# Patient Record
Sex: Female | Born: 1990 | Race: Black or African American | Hispanic: No | Marital: Single | Smoking: Never smoker
Health system: Southern US, Community
[De-identification: ages and names within clinical notes are randomized; demographics above are authoritative.]

## PROBLEM LIST (undated history)

## (undated) DIAGNOSIS — J45909 Unspecified asthma, uncomplicated: Secondary | ICD-10-CM

## (undated) HISTORY — PX: OTHER SURGICAL HISTORY: SHX169

## (undated) HISTORY — DX: Unspecified asthma, uncomplicated: J45.909

---
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
## 2012-04-27 IMAGING — CR DG CHEST 2V
1 series · 2 of 2 positions shown · non-contrast
Comparison: none

REASON FOR EXAM: wheezing, shortness of breath
COMMENTS:

PROCEDURE:     DXR - DXR CHEST PA (OR AP) AND LATERAL  - January 13, 2011  [DATE]
RESULT:     Comparison is made to the prior exam of 05/09/2008. The lung
fields are clear. The heart, mediastinal and osseous structures reveal no
significant abnormalities.

[Series 1: w chest pa · 0.14mm/px · 2 of 2 slices shown]
[im 1/2]
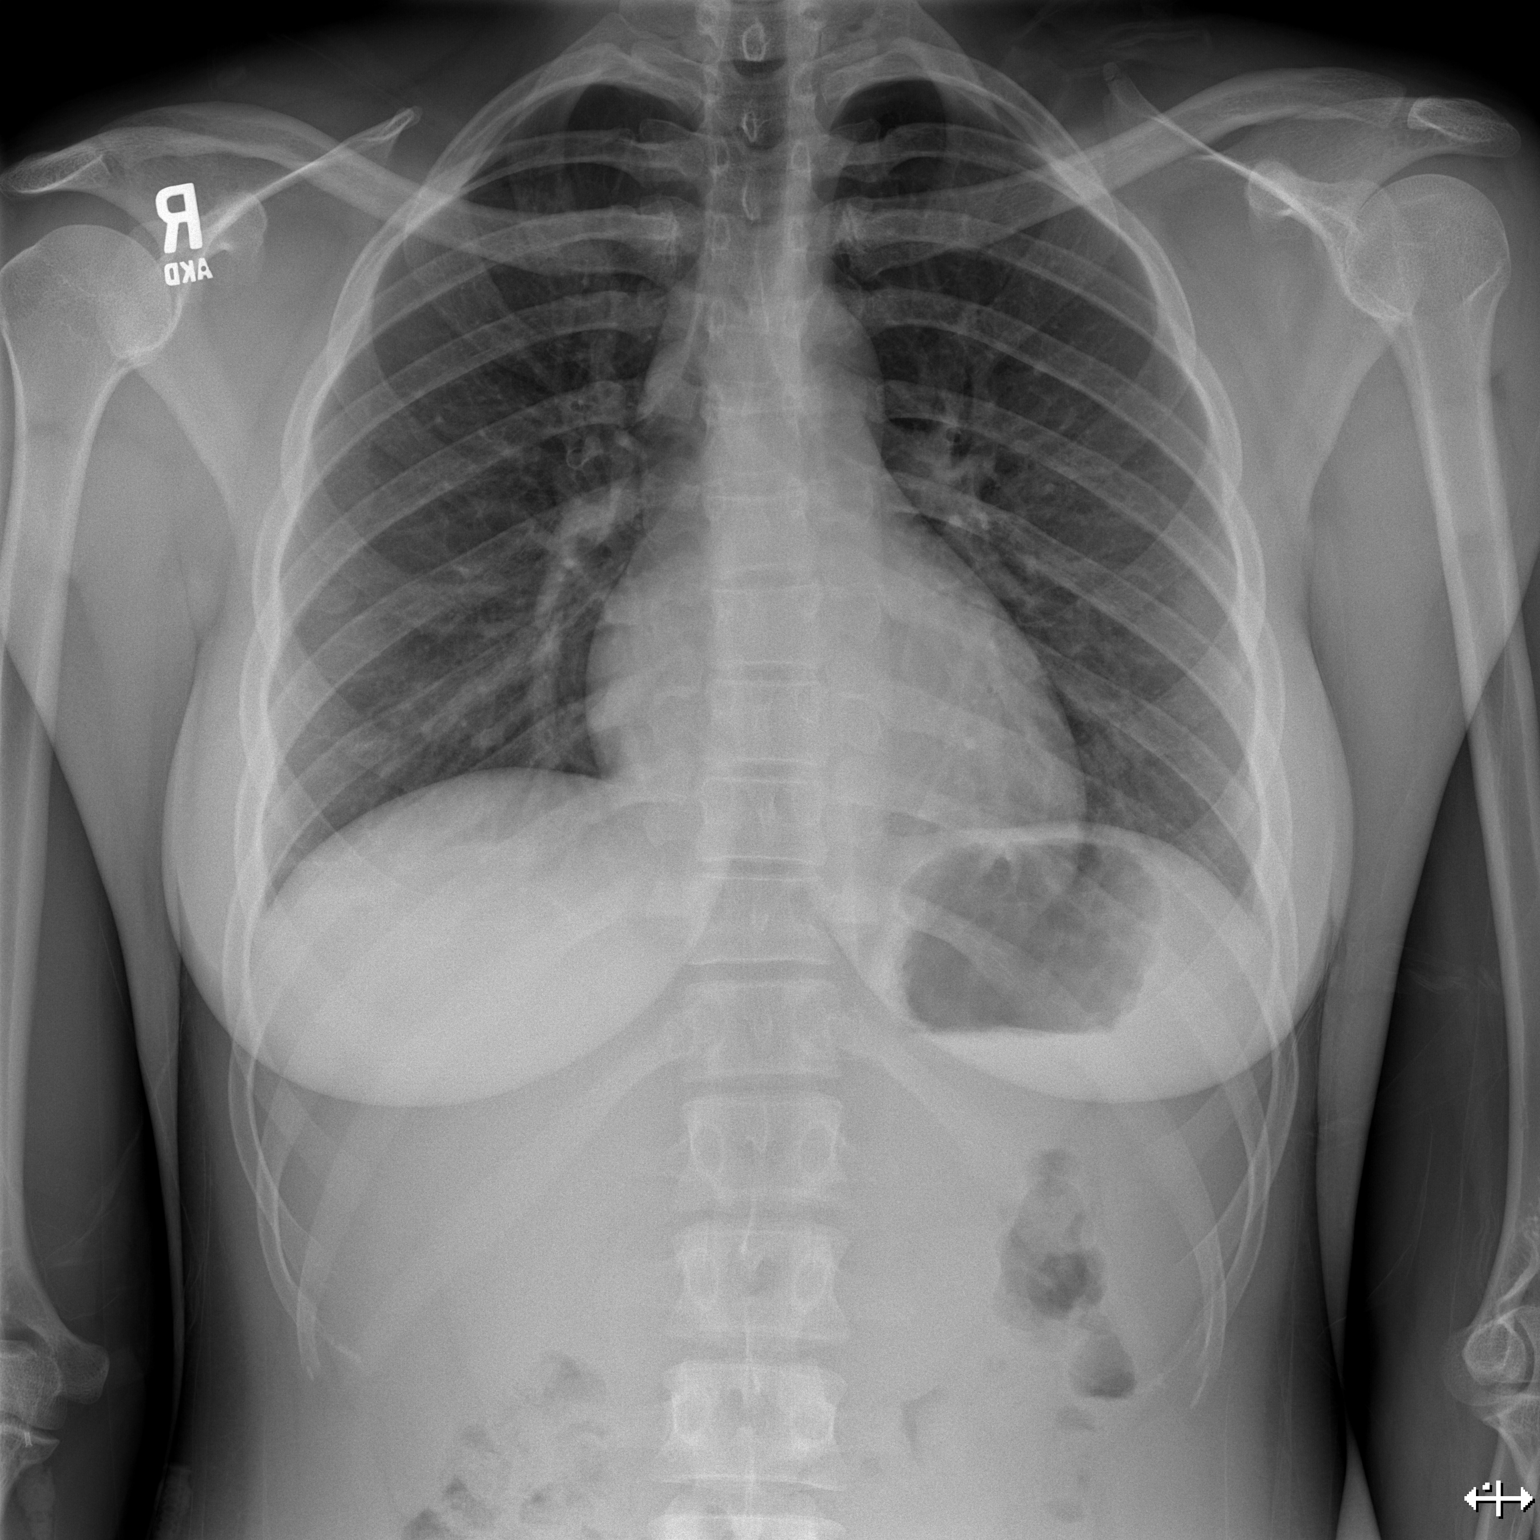
[im 2/2]
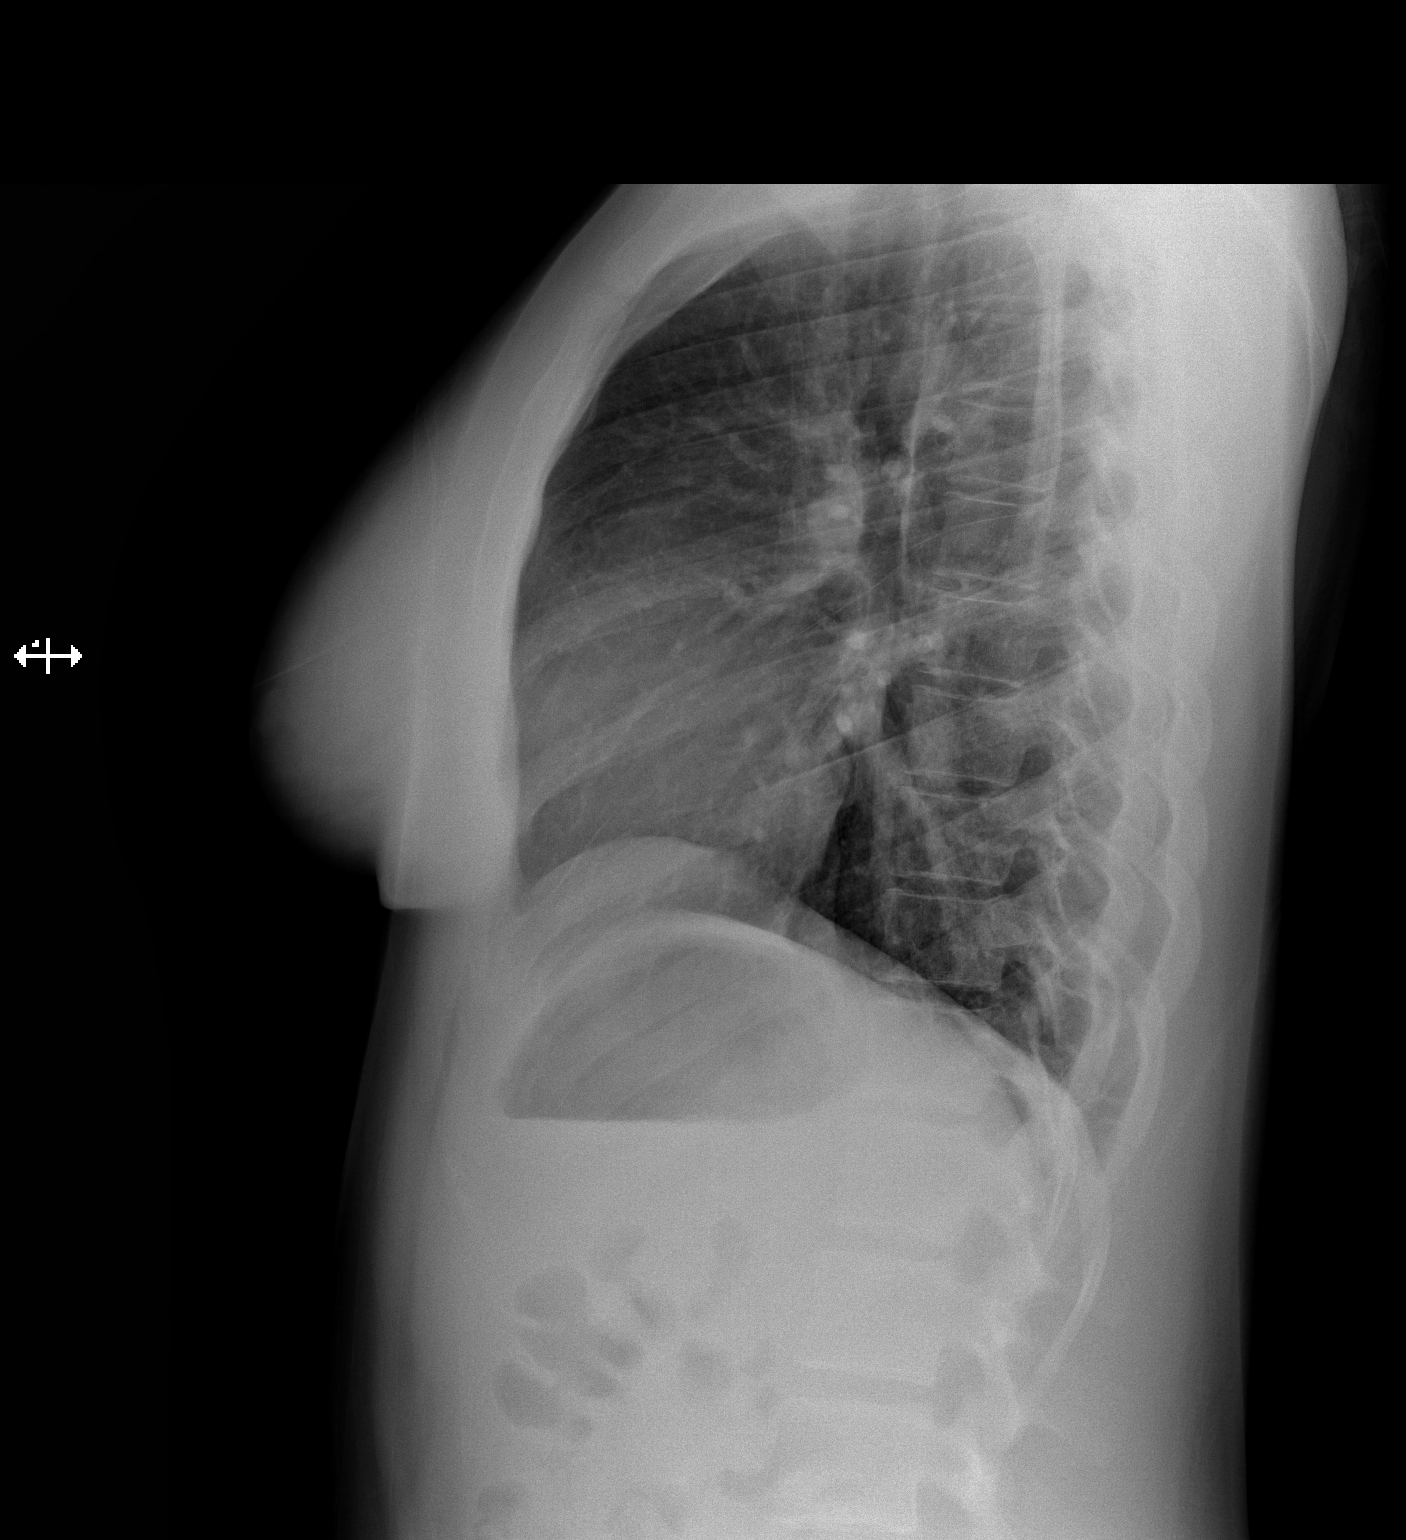

[2 of 2 positions shown; findings below may reference images not displayed]

IMPRESSION: No acute changes are identified.

## 2013-05-20 ENCOUNTER — Emergency Department: Payer: Self-pay | Admitting: Emergency Medicine

## 2013-12-15 ENCOUNTER — Emergency Department: Payer: Self-pay | Admitting: Internal Medicine

## 2014-01-19 ENCOUNTER — Emergency Department: Payer: Self-pay | Admitting: Emergency Medicine

## 2014-01-19 LAB — URINALYSIS, COMPLETE
Bilirubin,UR: NEGATIVE
Blood: NEGATIVE
Glucose,UR: NEGATIVE mg/dL (ref 0–75)
Ketone: NEGATIVE
Nitrite: NEGATIVE
PH: 6 (ref 4.5–8.0)
PROTEIN: NEGATIVE
RBC,UR: 1 /HPF (ref 0–5)
Specific Gravity: 1.02 (ref 1.003–1.030)
Squamous Epithelial: 4
WBC UR: 4 /HPF (ref 0–5)

## 2014-01-19 LAB — CBC WITH DIFFERENTIAL/PLATELET
BASOS ABS: 0.1 10*3/uL (ref 0.0–0.1)
Basophil %: 0.6 %
Eosinophil #: 0.2 10*3/uL (ref 0.0–0.7)
Eosinophil %: 2 %
HCT: 38 % (ref 35.0–47.0)
HGB: 13.1 g/dL (ref 12.0–16.0)
Lymphocyte #: 2.9 10*3/uL (ref 1.0–3.6)
Lymphocyte %: 33.4 %
MCH: 29.5 pg (ref 26.0–34.0)
MCHC: 34.4 g/dL (ref 32.0–36.0)
MCV: 86 fL (ref 80–100)
MONOS PCT: 4.6 %
Monocyte #: 0.4 x10 3/mm (ref 0.2–0.9)
Neutrophil #: 5.1 10*3/uL (ref 1.4–6.5)
Neutrophil %: 59.4 %
PLATELETS: 195 10*3/uL (ref 150–440)
RBC: 4.43 10*6/uL (ref 3.80–5.20)
RDW: 14.4 % (ref 11.5–14.5)
WBC: 8.7 10*3/uL (ref 3.6–11.0)

## 2014-01-19 LAB — COMPREHENSIVE METABOLIC PANEL
ALT: 12 U/L — AB
Albumin: 4.1 g/dL (ref 3.4–5.0)
Alkaline Phosphatase: 38 U/L — ABNORMAL LOW
Anion Gap: 6 — ABNORMAL LOW (ref 7–16)
BUN: 12 mg/dL (ref 7–18)
Bilirubin,Total: 1.2 mg/dL — ABNORMAL HIGH (ref 0.2–1.0)
CALCIUM: 8.9 mg/dL (ref 8.5–10.1)
CHLORIDE: 106 mmol/L (ref 98–107)
Co2: 27 mmol/L (ref 21–32)
Creatinine: 1.01 mg/dL (ref 0.60–1.30)
EGFR (African American): >60
EGFR (Non-African Amer.): >60
Glucose: 80 mg/dL (ref 65–99)
Osmolality: 276 (ref 275–301)
POTASSIUM: 3.5 mmol/L (ref 3.5–5.1)
SGOT(AST): 6 U/L — ABNORMAL LOW (ref 15–37)
Sodium: 139 mmol/L (ref 136–145)
Total Protein: 7.5 g/dL (ref 6.4–8.2)

## 2014-01-19 LAB — LIPASE, BLOOD: LIPASE: 106 U/L (ref 73–393)

## 2014-05-30 ENCOUNTER — Encounter: Payer: Self-pay | Admitting: Emergency Medicine

## 2014-05-30 ENCOUNTER — Emergency Department
Admission: EM | Admit: 2014-05-30 | Discharge: 2014-05-30 | Disposition: A | Discharge status: Home / Self Care | Payer: Self-pay | Attending: Emergency Medicine | Admitting: Emergency Medicine

## 2014-05-30 DIAGNOSIS — R1032 Left lower quadrant pain: Secondary | ICD-10-CM | POA: Insufficient documentation

## 2014-05-30 DIAGNOSIS — R110 Nausea: Secondary | ICD-10-CM | POA: Insufficient documentation

## 2014-05-30 LAB — URINALYSIS COMPLETE WITH MICROSCOPIC (ARMC ONLY)
BACTERIA UA: NONE SEEN
Bilirubin Urine: NEGATIVE
Glucose, UA: NEGATIVE mg/dL
Ketones, ur: NEGATIVE mg/dL
LEUKOCYTES UA: NEGATIVE
Nitrite: NEGATIVE
PROTEIN: NEGATIVE mg/dL
SPECIFIC GRAVITY, URINE: 1.019 (ref 1.005–1.030)
pH: 6 (ref 5.0–8.0)

## 2014-05-30 LAB — CBC WITH DIFFERENTIAL/PLATELET
Basophils Absolute: 0.1 10*3/uL (ref 0–0.1)
Basophils Relative: 1 %
EOS ABS: 0.3 10*3/uL (ref 0–0.7)
Eosinophils Relative: 3 %
HCT: 39.9 % (ref 35.0–47.0)
HEMOGLOBIN: 13.9 g/dL (ref 12.0–16.0)
LYMPHS PCT: 35 %
Lymphs Abs: 3.8 10*3/uL — ABNORMAL HIGH (ref 1.0–3.6)
MCH: 29.7 pg (ref 26.0–34.0)
MCHC: 34.8 g/dL (ref 32.0–36.0)
MCV: 85.4 fL (ref 80.0–100.0)
Monocytes Absolute: 0.4 10*3/uL (ref 0.2–0.9)
Monocytes Relative: 3 %
NEUTROS ABS: 6.4 10*3/uL (ref 1.4–6.5)
Neutrophils Relative %: 58 %
PLATELETS: 215 10*3/uL (ref 150–440)
RBC: 4.67 MIL/uL (ref 3.80–5.20)
RDW: 14.6 % — AB (ref 11.5–14.5)
WBC: 11 10*3/uL (ref 3.6–11.0)

## 2014-05-30 LAB — COMPREHENSIVE METABOLIC PANEL
ALT: 8 U/L — AB (ref 14–54)
ANION GAP: 6 (ref 5–15)
AST: 10 U/L — ABNORMAL LOW (ref 15–41)
Albumin: 4.8 g/dL (ref 3.5–5.0)
Alkaline Phosphatase: 41 U/L (ref 38–126)
BUN: 15 mg/dL (ref 6–20)
CO2: 28 mmol/L (ref 22–32)
CREATININE: 0.95 mg/dL (ref 0.44–1.00)
Calcium: 9.2 mg/dL (ref 8.9–10.3)
Chloride: 104 mmol/L (ref 101–111)
GFR calc Af Amer: >60 mL/min (ref >60)
Glucose, Bld: 104 mg/dL — ABNORMAL HIGH (ref 65–99)
Potassium: 3.4 mmol/L — ABNORMAL LOW (ref 3.5–5.1)
Sodium: 138 mmol/L (ref 135–145)
TOTAL PROTEIN: 8 g/dL (ref 6.5–8.1)
Total Bilirubin: 1.1 mg/dL (ref 0.3–1.2)

## 2014-05-30 LAB — POCT PREGNANCY, URINE: PREG TEST UR: NEGATIVE

## 2014-05-30 MED ORDER — ONDANSETRON 8 MG PO TBDP
8.0000 mg | ORAL_TABLET | Freq: Once | ORAL | Status: AC
  Administered 2014-05-30: 8 mg via ORAL

## 2014-05-30 NOTE — ED Notes (Signed)
Patient here with complaint of left lower abd pain and nausea that started this afternoon. Patient denies diarrhea or urinary symptoms.

## 2020-11-15 ENCOUNTER — Other Ambulatory Visit: Payer: Self-pay

## 2020-11-15 ENCOUNTER — Emergency Department
Admission: EM | Admit: 2020-11-15 | Discharge: 2020-11-15 | Disposition: A | Discharge status: Home / Self Care | Payer: Self-pay | Attending: Emergency Medicine | Admitting: Emergency Medicine

## 2020-11-15 DIAGNOSIS — Z3202 Encounter for pregnancy test, result negative: Secondary | ICD-10-CM | POA: Insufficient documentation

## 2020-11-15 LAB — POC URINE PREG, ED: Preg Test, Ur: NEGATIVE

## 2020-11-15 NOTE — ED Triage Notes (Signed)
Pt states "im really just here to find out if im pregnant, I took two at home test and they say negative but I havent had my period since sept" NAD noted

## 2020-11-15 NOTE — ED Provider Notes (Signed)
[facility] Emergency Department Provider Note   ____________________________________________   Event Date/Time   First MD Initiated Contact with Patient 11/15/20 1058     (approximate)  I have reviewed the triage vital signs and the nursing notes.   HISTORY  Chief Complaint Possible Pregnancy   HPI Christine Conway is a 30 y.o. female presents to the ED to find out if she is pregnant.  Patient states that she has taken 2 home pregnancy test both of which were negative.  Patient reports that she has not had a menses since September.  Patient relates that she has been in prison and had some healthcare and annual Pap smears which were all unremarkable.  She denies any abdominal pain or vaginal discharge.       History reviewed. No pertinent past medical history.  There are no problems to display for this patient.   History reviewed. No pertinent surgical history.  Prior to Admission medications   Not on File    Allergies Patient has no known allergies.  No family history on file.  Social History Social History   Tobacco Use   Smoking status: Never  Substance Use Topics   Alcohol use: No   Drug use: No    Review of Systems Constitutional: No fever/chills Eyes: No visual changes. ENT: No sore throat. Cardiovascular: Denies chest pain. Respiratory: Denies shortness of breath. Gastrointestinal: No abdominal pain.  No nausea, no vomiting.  No diarrhea.  No constipation. Genitourinary: Negative for dysuria.  Amenorrhea x2 months Musculoskeletal: Negative for back pain. Skin: Negative for rash. Neurological: Negative for headaches, focal weakness or numbness.   ____________________________________________   PHYSICAL EXAM:  VITAL SIGNS: ED Triage Vitals  Enc Vitals Group     BP 11/15/20 1048 139/80     Pulse Rate 11/15/20 1048 79     Resp 11/15/20 1048 16     Temp 11/15/20 1048 98.2 F (36.8 C)     Temp Source 11/15/20 1048  Oral     SpO2 11/15/20 1048 99 %     Weight 11/15/20 1046 22 lb (9.979 kg)     Height 11/15/20 1046 5\' 7"  (1.702 m)     Head Circumference --      Peak Flow --      Pain Score 11/15/20 1049 0     Pain Loc --      Pain Edu? --      Excl. in GC? --    Constitutional: Alert and oriented. Well appearing and in no acute distress. Eyes: Conjunctivae are normal. PERRL. EOMI. Head: Atraumatic. Nose: No congestion/rhinnorhea. Neck: No stridor.   Cardiovascular: Normal rate, regular rhythm. Grossly normal heart sounds.  Good peripheral circulation. Respiratory: Normal respiratory effort.  No retractions. Lungs CTAB. Gastrointestinal: Soft and nontender. No distention.  Musculoskeletal: Moves upper and lower extremities any difficulty.  Normal gait was noted. Neurologic:  Normal speech and language. No gross focal neurologic deficits are appreciated. No gait instability. Skin:  Skin is warm, dry and intact. No rash noted. Psychiatric: Mood and affect are normal. Speech and behavior are normal.  ____________________________________________   LABS (all labs ordered are listed, but only abnormal results are displayed)  Labs Reviewed  POC URINE PREG, ED     PROCEDURES  Procedure(s) performed (including Critical Care):  Procedures   ____________________________________________   INITIAL IMPRESSION / ASSESSMENT AND PLAN / ED COURSE  As part of my medical decision making, I reviewed the following data within the  electronic MEDICAL RECORD NUMBER Notes from prior ED visits and [place] Controlled Substance Database  30 year old female presents to the ED for pregnancy test.  Patient has been without menses for approximately 2 months.  Patient took 2 home pregnancy is both of which were negative.  Test in the emergency department was negative.  Patient denies any abdominal pain or vaginal discharge.  We discussed possible stress or lifestyle changes due to recently getting out of prison.  She is to  follow-up with the [facility] department if any continued problems or for annual pelvic exams as she does not currently have a PCP.   ____________________________________________   FINAL CLINICAL IMPRESSION(S) / ED DIAGNOSES  Final diagnoses:  Encounter for pregnancy test with result negative     ED Discharge Orders     None        Note:  This document was prepared using Dragon voice recognition software and may include unintentional dictation errors.    Tommi Rumps, PA-C 11/15/20 1131    Minna Antis, MD 11/15/20 1146

## 2020-11-15 NOTE — Discharge Instructions (Addendum)
Follow-up with [facility] department if any continued concerns and also annual Pap smears. Pregnancy test in the ED is negative.

## 2021-01-16 ENCOUNTER — Ambulatory Visit: Payer: Self-pay | Admitting: Nurse Practitioner

## 2021-02-09 ENCOUNTER — Encounter: Payer: Self-pay | Admitting: Advanced Practice Midwife

## 2021-02-09 ENCOUNTER — Other Ambulatory Visit: Payer: Self-pay

## 2021-02-09 ENCOUNTER — Ambulatory Visit (LOCAL_COMMUNITY_HEALTH_CENTER): Payer: Medicaid Other | Admitting: Advanced Practice Midwife

## 2021-02-09 VITALS — BP 119/70 | HR 57 | Temp 97.5°F | Ht 67.0 in | Wt 203.0 lb

## 2021-02-09 DIAGNOSIS — Z3009 Encounter for other general counseling and advice on contraception: Secondary | ICD-10-CM

## 2021-02-09 DIAGNOSIS — Z30013 Encounter for initial prescription of injectable contraceptive: Secondary | ICD-10-CM

## 2021-02-09 DIAGNOSIS — Z3202 Encounter for pregnancy test, result negative: Secondary | ICD-10-CM

## 2021-02-09 DIAGNOSIS — E669 Obesity, unspecified: Secondary | ICD-10-CM

## 2021-02-09 LAB — PREGNANCY, URINE: Preg Test, Ur: NEGATIVE

## 2021-02-09 LAB — HM HIV SCREENING LAB: HM HIV Screening: NEGATIVE

## 2021-02-09 MED ORDER — MEDROXYPROGESTERONE ACETATE 150 MG/ML IM SUSP
150.0000 mg | Freq: Once | INTRAMUSCULAR | Status: AC
  Administered 2021-02-09: 150 mg via INTRAMUSCULAR

## 2021-02-09 NOTE — Progress Notes (Signed)
ROI for all PAP smear reports signed and faxed to [facility] with fax confirmation received. Client's OPUS number included on ROI. Client tolerated Depo without complaint. Jossie Ng, RN

## 2021-02-09 NOTE — Progress Notes (Signed)
[facility] Number: [phone]    Family Planning Visit- Initial Visit  Subjective:  Christine Conway is a 31 y.o.  SBF nonsmoker [id number]   being seen today for an initial annual visit and to discuss reproductive life planning.  The patient is currently using No Method - Other Reason for pregnancy prevention. Patient reports   does not want a pregnancy in the next year.  Patient has the following medical conditions has Obesity BMI=31.7 on their problem list.  Chief Complaint  Patient presents with   Annual Exam    Patient reports here for physical, birth control, pap. Pt states Christine Conway is on her period and is refusing exam from waist down today but wants DMPA. Last sex "around Christmas" without condom; with current partner x 1 year; 1 partner in last 3 mo. LMP 02/06/21. Has been incarcerated from 2016-2022 for armed robbery. Thinks Christine Conway had a pap while incarcerated in [facility] in [place], [place].  employed 40 hrs/wk and living with her sister.  Last dental exam age 49. Last ETOH age 3 (89 [place]).  Patient denies cigs, vaping, cigars, MJ  Body mass index is 31.79 kg/m. - Patient is eligible for diabetes screening based on BMI and age [id number]?  not applicable [id number] ordered? not applicable  Patient reports 1  partner/s in last year. Desires STI screening?  No - refusing speculum exam, pap, cultures, bimanual today  Has patient been screened once for HCV in the past?  No  No results found for: HCVAB  Does the patient have current drug use (including MJ), have a partner with drug use, and/or has been incarcerated since last result? Yes  If yes-- Screen for HCV through [facility]   Does the patient meet criteria for HBV testing? Yes  Criteria:  -Household, sexual or needle sharing contact with HBV -History of drug use -HIV positive -Those with known Hep C   Health Maintenance Due  Topic Date Due    COVID-19 Vaccine (1) Never done   HIV Screening  Never done   Hepatitis C Screening  Never done   TETANUS/TDAP  Never done   PAP-Cervical Cytology Screening  Never done   PAP SMEAR-Modifier  Never done   INFLUENZA VACCINE  Never done    Review of Systems  All other systems reviewed and are negative.  The following portions of the patient's history were reviewed and updated as appropriate: allergies, current medications, past family history, past medical history, past social history, past surgical history and problem list. Problem list updated.   See flowsheet for other program required questions.  Objective:   Vitals:   02/09/21 1056  BP: 119/70  Pulse: (!) 57  Temp: (!) 97.5 F (36.4 C)  Weight: 203 lb (92.1 kg)  Height: 5\' 7"  (1.702 m)    Physical Exam Constitutional:      Appearance: Normal appearance. Christine Conway is obese.  HENT:     Head: Normocephalic and atraumatic.     Mouth/Throat:     Mouth: Mucous membranes are moist.     Comments: Last dental exam age 63; needs exam asap Eyes:     Conjunctiva/sclera: Conjunctivae normal.  Neck:     Thyroid: No thyroid mass, thyromegaly or thyroid tenderness.  Cardiovascular:     Rate and Rhythm: Normal rate and regular rhythm.  Pulmonary:     Effort: Pulmonary effort is normal.     Breath sounds: Normal breath  sounds.  Chest:  Breasts:    Right: Normal.     Left: Normal.  Abdominal:     Palpations: Abdomen is soft.     Comments: Soft without masses or tenderness, good tone  Genitourinary:    Comments: Pt refused exam below waist due to menses today. Informed would need pap, bimanual before more DMPA to be given Musculoskeletal:        General: Normal range of motion.     Cervical back: Normal range of motion and neck supple.  Skin:    General: Skin is warm and dry.  Neurological:     Mental Status: Christine Conway is alert.  Psychiatric:        Mood and Affect: Mood normal.      Assessment and Plan:  Christine Conway is  a 31 y.o. female presenting to the [facility] Department for an initial annual wellness/contraceptive visit  Contraception counseling: Reviewed all forms of birth control options in the tiered based approach. available including abstinence; over the counter/barrier methods; hormonal contraceptive medication including pill, patch, ring, injection,contraceptive implant, ECP; hormonal and nonhormonal IUDs; permanent sterilization options including vasectomy and the various tubal sterilization modalities. Risks, benefits, and typical effectiveness rates were reviewed.  Questions were answered.  Written information was also given to the patient to review.  Patient desires Hormonal Injection, this was prescribed for patient.    The patient will follow up in  11-13 wks for surveillance.  The patient was told to call with any further questions, or with any concerns about this method of contraception.  Emphasized use of condoms 100% of the time for STI prevention.  Patient was not offered ECP based on not meeting criteria. ECP was not accepted by the patient. ECP counseling was not given - see RN documentation  1. Family planning Immunization nurse consult Please give pt dental list and encourage exam asap Please give pt primary care MD list  ROI for pap while incarcerated at [facility] in [place], [place]  - Pregnancy, urine - [facility] - Syphilis Serology,  Lab  2. Encounter for initial prescription of injectable contraceptive If PT neg today pt desires DMPA 150 mg IM x1 Please counsel on need for abstinance x 7 days  Pt counseled cannot receive any more DMPA unless has completion of physical with pap and bimanual exam  3. Obesity, unspecified classification, unspecified obesity type, unspecified whether serious comorbidity present      No follow-ups on file.  Future Appointments  Date Time Provider [place]  05/15/2021  9:40 AM Jon Billings, NP [facility] [place], CNM

## 2021-03-13 ENCOUNTER — Other Ambulatory Visit: Payer: Self-pay

## 2021-03-13 ENCOUNTER — Ambulatory Visit (LOCAL_COMMUNITY_HEALTH_CENTER): Payer: Medicaid Other | Admitting: Family Medicine

## 2021-03-13 VITALS — BP 123/80 | Ht 69.0 in | Wt 196.8 lb

## 2021-03-13 DIAGNOSIS — Z309 Encounter for contraceptive management, unspecified: Secondary | ICD-10-CM

## 2021-03-13 DIAGNOSIS — Z3042 Encounter for surveillance of injectable contraceptive: Secondary | ICD-10-CM

## 2021-03-13 DIAGNOSIS — Z124 Encounter for screening for malignant neoplasm of cervix: Secondary | ICD-10-CM

## 2021-03-13 MED ORDER — MEDROXYPROGESTERONE ACETATE 150 MG/ML IM SUSP: 150.0000 mg | INTRAMUSCULAR | Status: AC

## 2021-03-13 NOTE — Progress Notes (Signed)
? ?  [facility] problem visit  [facility] ? ?Subjective:  ?Christine Conway is a 31 y.o. being seen today for  ? ?Chief Complaint  ?Patient presents with  ? Annual Exam  ? Contraception  ?  depo  ? ? ?Pt here for pap and bimanual exam. Pt reports that she did not do pap and bimanual at last visit d/t menses.   ? ? ? ?Does the patient have a current or past history of drug use? No  ? No components found for: HCV] ? ? ?Health Maintenance Due  ?Topic Date Due  ? COVID-19 Vaccine (1) Never done  ? Hepatitis C Screening  Never done  ? TETANUS/TDAP  Never done  ? PAP-Cervical Cytology Screening  Never done  ? PAP SMEAR-Modifier  Never done  ? INFLUENZA VACCINE  Never done  ? ? ?ROS ? ?The following portions of the patient's history were reviewed and updated as appropriate: allergies, current medications, past family history, past medical history, past social history, past surgical history and problem list. Problem list updated. ? ? ?See flowsheet for other program required questions. ? ?Objective:  ? ?Vitals:  ? 03/13/21 1109  ?BP: 123/80  ?Weight: 196 lb 12.8 oz (89.3 kg)  ?Height: 5\' 9"  (1.753 m)  ? ? ?Physical Exam ?Nursing note reviewed.  ?Constitutional:   ?   Appearance: Normal appearance.  ?HENT:  ?   Head: Normocephalic and atraumatic.  ?Pulmonary:  ?   Effort: Pulmonary effort is normal.  ?Genitourinary: ?   General: Normal vulva.  ?   Rectum: Normal.  ?   Comments: External genitalia without, lice, nits, erythema, edema , lesions or inguinal adenopathy. Vagina with normal mucosa and discharge and pH equals 4.  Cervix without visual lesions, uterus firm, mobile, non-tender, no masses, CMT adnexal fullness or tenderness.  ? ?Skin: ?   General: Skin is warm and dry.  ?Neurological:  ?   Mental Status: She is alert.  ? ? ? ? ?Assessment and Plan:  ?Christine Conway is a 31 y.o. female presenting to the [facility] Department for a Women's Health problem visit ? ?1.  Encounter for Papanicolaou smear of cervix ?Pap today, 1/2 of patient physical was done on 02/09/21.  Pt at that time refused pap and bimanual d/t menses.   ?Did discuss with patient that in the future that pap could be completed on menses if she felt comfortable.   ? ?Pt declined STI testing today.  ? ?- IGP, rfx Aptima HPV ASCU ? ?2. Encounter for surveillance of injectable contraceptive ?Pt due next depo 04/27/2021   ?Rx ordered for DMPA 150 mg IM  every 11-13 weeks until 02/2022 ? ?- medroxyPROGESTERone (DEPO-PROVERA) injection 150 mg ? ? ? ? ?No follow-ups on file. ? ?Future Appointments  ?Date Time Provider Department Center  ?05/15/2021  9:40 AM 07/15/2021, NP [facility]  ? ? ?Larae Grooms, FNP ?

## 2021-03-15 LAB — IGP, RFX APTIMA HPV ASCU: PAP Smear Comment: 0

## 2021-03-22 NOTE — Progress Notes (Addendum)
Pt here for PE, Pap and BC. Berdie Ogren, RN ? ?

## 2021-04-23 ENCOUNTER — Emergency Department
Admission: EM | Admit: 2021-04-23 | Discharge: 2021-04-23 | Disposition: A | Discharge status: Home / Self Care | Payer: Medicaid Other | Attending: Emergency Medicine | Admitting: Emergency Medicine

## 2021-04-23 ENCOUNTER — Other Ambulatory Visit: Payer: Self-pay

## 2021-04-23 ENCOUNTER — Encounter: Payer: Self-pay | Admitting: *Deleted

## 2021-04-23 DIAGNOSIS — R42 Dizziness and giddiness: Secondary | ICD-10-CM | POA: Insufficient documentation

## 2021-04-23 DIAGNOSIS — Z5321 Procedure and treatment not carried out due to patient leaving prior to being seen by health care provider: Secondary | ICD-10-CM | POA: Insufficient documentation

## 2021-04-23 LAB — CBC
HCT: 36.4 % (ref 36.0–46.0)
Hemoglobin: 12.7 g/dL (ref 12.0–15.0)
MCH: 28.5 pg (ref 26.0–34.0)
MCHC: 34.9 g/dL (ref 30.0–36.0)
MCV: 81.6 fL (ref 80.0–100.0)
Platelets: 280 10*3/uL (ref 150–400)
RBC: 4.46 MIL/uL (ref 3.87–5.11)
RDW: 15.6 % — ABNORMAL HIGH (ref 11.5–15.5)
WBC: 10.5 10*3/uL (ref 4.0–10.5)
nRBC: 0 % (ref 0.0–0.2)

## 2021-04-23 LAB — BASIC METABOLIC PANEL
Anion gap: 8 (ref 5–15)
BUN: 12 mg/dL (ref 6–20)
CO2: 22 mmol/L (ref 22–32)
Calcium: 8.8 mg/dL — ABNORMAL LOW (ref 8.9–10.3)
Chloride: 109 mmol/L (ref 98–111)
Creatinine, Ser: 0.92 mg/dL (ref 0.44–1.00)
GFR, Estimated: >60 mL/min (ref >60)
Glucose, Bld: 130 mg/dL — ABNORMAL HIGH (ref 70–99)
Potassium: 3.5 mmol/L (ref 3.5–5.1)
Sodium: 139 mmol/L (ref 135–145)

## 2021-04-23 LAB — TROPONIN I (HIGH SENSITIVITY): Troponin I (High Sensitivity): <2 ng/L (ref <18)

## 2021-04-23 NOTE — ED Triage Notes (Signed)
No answer when called x 2

## 2021-04-23 NOTE — ED Notes (Addendum)
No answer when called x1; no answer when phone # listed in chart called ?

## 2021-04-23 NOTE — ED Triage Notes (Addendum)
Pt reports high blood pressure at work last night and again today.  No meds for htn.  No headache.  No n/v/d.   Pt reports dizziness last night and today.   Pt reports needing a note to return to work,  pt alert  speech clear. ?

## 2021-04-30 ENCOUNTER — Ambulatory Visit: Payer: Medicaid Other

## 2021-05-12 ENCOUNTER — Encounter: Payer: Self-pay | Admitting: Emergency Medicine

## 2021-05-12 ENCOUNTER — Other Ambulatory Visit: Payer: Self-pay

## 2021-05-12 ENCOUNTER — Emergency Department
Admission: EM | Admit: 2021-05-12 | Discharge: 2021-05-12 | Disposition: A | Discharge status: Home / Self Care | Payer: Medicaid Other | Attending: Student in an Organized Health Care Education/Training Program | Admitting: Student in an Organized Health Care Education/Training Program

## 2021-05-12 DIAGNOSIS — N898 Other specified noninflammatory disorders of vagina: Secondary | ICD-10-CM | POA: Insufficient documentation

## 2021-05-12 LAB — URINALYSIS, ROUTINE W REFLEX MICROSCOPIC
Bilirubin Urine: NEGATIVE
Glucose, UA: NEGATIVE mg/dL
Hgb urine dipstick: NEGATIVE
Ketones, ur: 5 mg/dL — AB
Leukocytes,Ua: NEGATIVE
Nitrite: NEGATIVE
Protein, ur: NEGATIVE mg/dL
Specific Gravity, Urine: 1.028 (ref 1.005–1.030)
pH: 5 (ref 5.0–8.0)

## 2021-05-12 LAB — POC URINE PREG, ED: Preg Test, Ur: NEGATIVE

## 2021-05-12 LAB — CHLAMYDIA/NGC RT PCR (ARMC ONLY): Chlamydia Tr: NOT DETECTED

## 2021-05-12 LAB — WET PREP, GENITAL
Clue Cells Wet Prep HPF POC: NONE SEEN
Sperm: NONE SEEN
Trich, Wet Prep: NONE SEEN
WBC, Wet Prep HPF POC: >=10 — AB (ref <10)
Yeast Wet Prep HPF POC: NONE SEEN

## 2021-05-12 LAB — CHLAMYDIA/NGC RT PCR (ARMC ONLY)??????????: N gonorrhoeae: NOT DETECTED

## 2021-05-12 MED ORDER — METRONIDAZOLE 500 MG PO TABS: 500.0000 mg | ORAL_TABLET | Freq: Two times a day (BID) | ORAL | 0 refills | Status: AC

## 2021-05-12 NOTE — ED Provider Notes (Signed)
? ?[facility] ?Provider Note ? ? ? Event Date/Time  ? First MD Initiated Contact with Patient 05/12/21 1605   ?  (approximate) ? ? ?History  ? ?Vaginal Discharge ? ? ?HPI ? ?Christine Conway is a 31 y.o. female with a past medical history of obesity presents today for evaluation of vaginal discharge and foul odor for the past 3 weeks.  She reports that she thinks that she has a urinary tract infection, however she denies any burning with urination, urinary urgency or frequency.  She reports that she has also had some vaginal spotting.  She reports that she is sexually active with 1 female partner, they do not use condoms.  She denies any abdominal pain or flank pain.  No fevers or chills. ? ?Patient Active Problem List  ? Diagnosis Date Noted  ? Obesity BMI=31.7 02/09/2021  ? ?  ? ?  ? ? ?Physical Exam  ? ?Triage Vital Signs: ?ED Triage Vitals  ?Enc Vitals Group  ?   BP 05/12/21 1529 131/90  ?   Pulse Rate 05/12/21 1529 64  ?   Resp 05/12/21 1529 20  ?   Temp 05/12/21 1529 98.7 ?F (37.1 ?C)  ?   Temp Source 05/12/21 1529 Oral  ?   SpO2 05/12/21 1529 98 %  ?   Weight 05/12/21 1526 192 lb (87.1 kg)  ?   Height 05/12/21 1526 5\' 9"  (1.753 m)  ?   Head Circumference --   ?   Peak Flow --   ?   Pain Score 05/12/21 1526 0  ?   Pain Loc --   ?   Pain Edu? --   ?   Excl. in GC? --   ? ? ?Most recent vital signs: ?Vitals:  ? 05/12/21 1529  ?BP: 131/90  ?Pulse: 64  ?Resp: 20  ?Temp: 98.7 ?F (37.1 ?C)  ?SpO2: 98%  ? ? ?General: Awake, no distress.  ?CV:  Good peripheral perfusion.  ?Resp:  Normal effort.  ?Abd:  No distention.  Abdomen is soft and nontender ?Pelvic:  Normal external genitalia.  Scant grey discharge in the vaginal vault.  No obvious odor. Closed cervical os.  No cervical motion tenderness.  No adnexal fullness or tenderness.  ? ? ? ?ED Results / Procedures / Treatments  ? ?Labs ?(all labs ordered are listed, but only abnormal results are displayed) ?Labs Reviewed  ?WET PREP, GENITAL -  Abnormal; Notable for the following components:  ?    Result Value  ? WBC, Wet Prep HPF POC >=10 (*)   ? All other components within normal limits  ?URINALYSIS, ROUTINE W REFLEX MICROSCOPIC - Abnormal; Notable for the following components:  ? Color, Urine YELLOW (*)   ? APPearance CLEAR (*)   ? Ketones, ur 5 (*)   ? All other components within normal limits  ?CHLAMYDIA/NGC RT PCR ([facility] ONLY)            ?WET PREP, GENITAL  ?POC URINE PREG, ED  ? ? ? ?EKG ? ? ? ? ?RADIOLOGY ? ? ? ? ?PROCEDURES: ? ?Critical Care performed:  ? ?Procedures ? ? ?MEDICATIONS ORDERED IN ED: ?Medications - No data to display ? ? ?IMPRESSION / MDM / ASSESSMENT AND PLAN / ED COURSE  ?I reviewed the triage vital signs and the nursing notes. ? ? ?Differential diagnosis includes, but is not limited to, bacterial vaginosis, sexually transmitted disease, urinary tract infection, yeast infection.  No abdominal pain or adnexal  tenderness or fullness, do not suspect TOA.  No cervical motion tenderness to suggest PID.  Urine does not appear to be infected, unlikely UTI.  No flank pain to suggest stone or pyelonephritis.  She is not systemically ill appearing.  Pelvic exam demonstrates scant gray discharge.  She has no cervical motion tenderness to suggest PID.  No adnexal fullness or tenderness.  Wet mount and GC/chlamydia obtained.  I was called by the lab who reports that it appears that the patient might have trichomonas, however they cannot definitively say this because the wet prep sat too long before analysis.  They attempted to add it onto the her already obtained urinalysis, however urine was obtained too long ago for this to be accurate.  I discussed this with the patient who would like to simply be treated empirically for trichomonas and does not wish to provide another sample.  She does not wish to be empirically treated for gonorrhea/chlamydia, though agrees to treatment if this returns positive.  I advised no alcohol with the Flagyl.  We  discussed that her partner should also be tested and treated.  We discussed strict return precautions and the importance of close outpatient follow-up. Patient understands and agrees with plan. ? ? ?Clinical Course as of 05/12/21 2035  ?Sat May 12, 2021  ?1748 Lab called to report that the wet prep sample was received too late. Looks like potentially trich but they can not say for certain given length of time before sample was analyzed. They will attempt to obtain on urine, otherwise will need a repeat sample [JP]  ?1800 Discussed possibilitity of repeat testing with patient. Patient would like to be treated empirically for trich, but not for gonorrhea/chlamydia. She is requesting discharge at this time [JP]  ?  ?Clinical Course User Index ?[JP] Marcelino Campos, Herb Grays, PA-C  ? ? ? ?FINAL CLINICAL IMPRESSION(S) / ED DIAGNOSES  ? ?Final diagnoses:  ?Vaginal discharge  ? ? ? ?Rx / DC Orders  ? ?ED Discharge Orders   ? ?      Ordered  ?  metroNIDAZOLE (FLAGYL) 500 MG tablet  2 times daily       ? 05/12/21 1751  ? ?  ?  ? ?  ? ? ? ?Note:  This document was prepared using Dragon voice recognition software and may include unintentional dictation errors. ?  ?Jackelyn Hoehn, PA-C ?05/12/21 2035 ? ?  ?Willy Eddy, MD ?05/13/21 1118 ? ?

## 2021-05-12 NOTE — Discharge Instructions (Signed)
You may take the medication as prescribed.  Please do not drink alcohol with this medication or eat will make you throw up.  If the other results are positive we will call you to initiate further antibiotics.  Please return for any new, worsening, or change in symptoms or other concerns.  It was a pleasure caring for you today. ?

## 2021-05-12 NOTE — ED Triage Notes (Signed)
Pt via POV from home. Pt c/o vaginal discharge and foul odor to her urine for the past 3 weeks, thinks she may have a UTI. Pt is A&OX4 and NAD ?

## 2021-05-14 NOTE — Progress Notes (Deleted)
   LMP 04/23/2021 (Exact Date)    Subjective:    Patient ID: Christine Conway, female    DOB: [birth year], 31 y.o.   MRN: [id number]  HPI: Christine Conway is a 31 y.o. female  No chief complaint on file.  Patient presents to clinic to establish care with new PCP.  Introduced to Publishing rights manager role and practice setting.  All questions answered.  Discussed provider/patient relationship and expectations.  Patient reports a history of ***. Patient denies a history of: Hypertension, Elevated Cholesterol, Diabetes, Thyroid problems, Depression, Anxiety, Neurological problems, and Abdominal problems.   Active Ambulatory Problems    Diagnosis Date Noted   Obesity BMI=31.7 02/09/2021   Resolved Ambulatory Problems    Diagnosis Date Noted   No Resolved Ambulatory Problems   Past Medical History:  Diagnosis Date   Asthma    Past Surgical History:  Procedure Laterality Date   Denies surgical hx     Family History  Problem Relation Age of Onset   Heart attack Father    Cancer Mother    Breast cancer Cousin    Kidney disease Cousin      Review of Systems  Per HPI unless specifically indicated above     Objective:    LMP 04/23/2021 (Exact Date)   Wt Readings from Last 3 Encounters:  05/12/21 192 lb (87.1 kg)  04/23/21 196 lb (88.9 kg)  03/13/21 196 lb 12.8 oz (89.3 kg)    Physical Exam  Results for orders placed or performed during the hospital encounter of 05/12/21  Chlamydia/NGC rt PCR ([facility] only)   Specimen: Cervix  Result Value Ref Range   Specimen source GC/Chlam ENDOCERVICAL    Chlamydia Tr NOT DETECTED NOT DETECTED   N gonorrhoeae NOT DETECTED NOT DETECTED  Wet prep, genital   Specimen: Cervix  Result Value Ref Range   Yeast Wet Prep HPF POC NONE SEEN NONE SEEN   Trich, Wet Prep NONE SEEN NONE SEEN   Clue Cells Wet Prep HPF POC NONE SEEN NONE SEEN   WBC, Wet Prep HPF POC >=10 (A) <10   Sperm NONE SEEN   Urinalysis, Routine w reflex microscopic  Result  Value Ref Range   Color, Urine YELLOW (A) YELLOW   APPearance CLEAR (A) CLEAR   Specific Gravity, Urine 1.028 1.005 - 1.030   pH 5.0 5.0 - 8.0   Glucose, UA NEGATIVE NEGATIVE mg/dL   Hgb urine dipstick NEGATIVE NEGATIVE   Bilirubin Urine NEGATIVE NEGATIVE   Ketones, ur 5 (A) NEGATIVE mg/dL   Protein, ur NEGATIVE NEGATIVE mg/dL   Nitrite NEGATIVE NEGATIVE   Leukocytes,Ua NEGATIVE NEGATIVE  POC Urine Pregnancy, ED  Result Value Ref Range   Preg Test, Ur Negative Negative      Assessment & Plan:   Problem List Items Addressed This Visit   None    Follow up plan: No follow-ups on file.

## 2021-05-15 ENCOUNTER — Ambulatory Visit: Payer: Self-pay | Admitting: Nurse Practitioner

## 2021-06-21 ENCOUNTER — Emergency Department
Admission: EM | Admit: 2021-06-21 | Discharge: 2021-06-21 | Disposition: A | Discharge status: Home / Self Care | Payer: Medicaid Other | Attending: Emergency Medicine | Admitting: Emergency Medicine

## 2021-06-21 ENCOUNTER — Other Ambulatory Visit: Payer: Self-pay

## 2021-06-21 DIAGNOSIS — N760 Acute vaginitis: Secondary | ICD-10-CM | POA: Insufficient documentation

## 2021-06-21 DIAGNOSIS — J45909 Unspecified asthma, uncomplicated: Secondary | ICD-10-CM | POA: Insufficient documentation

## 2021-06-21 DIAGNOSIS — B9689 Other specified bacterial agents as the cause of diseases classified elsewhere: Secondary | ICD-10-CM

## 2021-06-21 LAB — URINALYSIS, ROUTINE W REFLEX MICROSCOPIC
Bilirubin Urine: NEGATIVE
Glucose, UA: NEGATIVE mg/dL
Hgb urine dipstick: NEGATIVE
Ketones, ur: NEGATIVE mg/dL
Leukocytes,Ua: NEGATIVE
Nitrite: NEGATIVE
Protein, ur: NEGATIVE mg/dL
Specific Gravity, Urine: 1.014 (ref 1.005–1.030)
pH: 6 (ref 5.0–8.0)

## 2021-06-21 LAB — WET PREP, GENITAL
Sperm: NONE SEEN
Trich, Wet Prep: NONE SEEN
WBC, Wet Prep HPF POC: <10 (ref <10)
Yeast Wet Prep HPF POC: NONE SEEN

## 2021-06-21 LAB — POC URINE PREG, ED: Preg Test, Ur: NEGATIVE

## 2021-06-21 LAB — CHLAMYDIA/NGC RT PCR (ARMC ONLY)
Chlamydia Tr: NOT DETECTED
N gonorrhoeae: NOT DETECTED

## 2021-06-21 MED ORDER — METRONIDAZOLE 500 MG PO TABS: 500.0000 mg | ORAL_TABLET | Freq: Two times a day (BID) | ORAL | 0 refills | Status: DC

## 2021-06-21 NOTE — ED Notes (Signed)
See triage note  presents with some urinary freq with some odor   denies any fever or back pain

## 2021-06-21 NOTE — ED Provider Notes (Signed)
[facility] Provider Note    Event Date/Time   First MD Initiated Contact with Patient 06/21/21 [phone]     (approximate)   History   Vaginal Discharge   HPI  Christine Conway is a 31 y.o. female   Christine Conway to the ED with complaint of vaginal discharge and odor for approximately 2 weeks.  Patient states that she initially thought she had a UTI but denies any dysuria or frequency.  Currently she is having her menses.  She denies any fever, chills, nausea or vomiting.  Patient has a history of asthma but otherwise healthy.      Physical Exam   Triage Vital Signs: ED Triage Vitals [06/21/21 0829]  Enc Vitals Group     BP 131/86     Pulse Rate (!) 59     Resp 16     Temp 98.1 F (36.7 C)     Temp Source Oral     SpO2 95 %     Weight 192 lb (87.1 kg)     Height 5\' 9"  (1.753 m)     Head Circumference      Peak Flow      Pain Score 0     Pain Loc      Pain Edu?      Excl. in GC?     Most recent vital signs: Vitals:   06/21/21 0829  BP: 131/86  Pulse: (!) 59  Resp: 16  Temp: 98.1 F (36.7 C)  SpO2: 95%     General: Awake, no distress.  CV:  Good peripheral perfusion.  Resp:  Normal effort.  Abd:  No distention.  Other:  External genitalia unremarkable.  There is no active bleeding at this time however there is blood in the vaginal vault.  No exudate is present.  No adnexal tenderness or masses noted.  No cervical motion tenderness present.   ED Results / Procedures / Treatments   Labs (all labs ordered are listed, but only abnormal results are displayed) Labs Reviewed  WET PREP, GENITAL - Abnormal; Notable for the following components:      Result Value   Clue Cells Wet Prep HPF POC PRESENT (*)    All other components within normal limits  URINALYSIS, ROUTINE W REFLEX MICROSCOPIC - Abnormal; Notable for the following components:   Color, Urine YELLOW (*)    APPearance CLEAR (*)    All other components within normal limits   CHLAMYDIA/NGC RT PCR ([facility] ONLY)            POC URINE PREG, ED      PROCEDURES:  Critical Care performed:   Procedures   MEDICATIONS ORDERED IN ED: Medications - No data to display   IMPRESSION / MDM / ASSESSMENT AND PLAN / ED COURSE  I reviewed the triage vital signs and the nursing notes.   Differential diagnosis includes, but is not limited to, bacterial vaginosis, urinary tract infection, chlamydia, gonorrhea.  31 year old female presents to the ED with complaint of vaginal odor for the last 2 weeks thinking that she has a urinary tract infection but did not develop any dysuria or frequency.  She denies any fever or chills.  Pelvic exam was unremarkable.  Wet prep did show clue cells present and patient was made aware that this was a bacterial vaginosis and the treatment would be a antibiotic called metronidazole.  She is aware that she cannot drink alcohol or cough medication containing alcohol while taking this medication.  She is to follow-up with her PCP if any continued problems and a prescription was sent to her pharmacy.      Patient's presentation is most consistent with acute complicated illness / injury requiring diagnostic workup.  FINAL CLINICAL IMPRESSION(S) / ED DIAGNOSES   Final diagnoses:  BV (bacterial vaginosis)     Rx / DC Orders   ED Discharge Orders          Ordered    metroNIDAZOLE (FLAGYL) 500 MG tablet  2 times daily        06/21/21 1045             Note:  This document was prepared using Dragon voice recognition software and may include unintentional dictation errors.   Tommi Rumps, PA-C 06/21/21 1055    Minna Antis, MD 06/21/21 1539

## 2021-06-21 NOTE — Discharge Instructions (Signed)
Follow-up with your primary care provider if any continued problems or concerns.  Wet prep today shows clue cells which indicates you have a bacterial vaginosis and is treated with an antibiotic called Flagyl.  Take this twice a day every day for the next 7 days.

## 2021-06-21 NOTE — ED Triage Notes (Signed)
Pt states she has been having vaginal discharge with an odor x2 weeks- pt states she thinks she has a UTI but denies dysuria

## 2021-07-09 ENCOUNTER — Other Ambulatory Visit: Payer: Self-pay

## 2021-07-09 ENCOUNTER — Emergency Department
Admission: EM | Admit: 2021-07-09 | Discharge: 2021-07-09 | Discharge status: Against Medical Advice | Payer: Medicaid Other | Attending: Emergency Medicine | Admitting: Emergency Medicine

## 2021-07-09 DIAGNOSIS — N390 Urinary tract infection, site not specified: Secondary | ICD-10-CM | POA: Insufficient documentation

## 2021-07-09 DIAGNOSIS — Z5321 Procedure and treatment not carried out due to patient leaving prior to being seen by health care provider: Secondary | ICD-10-CM | POA: Insufficient documentation

## 2021-07-09 DIAGNOSIS — R21 Rash and other nonspecific skin eruption: Secondary | ICD-10-CM | POA: Insufficient documentation

## 2021-07-09 LAB — URINALYSIS, ROUTINE W REFLEX MICROSCOPIC
Bilirubin Urine: NEGATIVE
Glucose, UA: NEGATIVE mg/dL
Hgb urine dipstick: NEGATIVE
Ketones, ur: NEGATIVE mg/dL
Leukocytes,Ua: NEGATIVE
Nitrite: NEGATIVE
Protein, ur: NEGATIVE mg/dL
Specific Gravity, Urine: 1.017 (ref 1.005–1.030)
pH: 6 (ref 5.0–8.0)

## 2021-07-09 LAB — POC URINE PREG, ED: Preg Test, Ur: NEGATIVE

## 2021-07-09 NOTE — ED Triage Notes (Signed)
Pt has rash to arms and also UTI. Pt given meds for UTI but states they are not working.

## 2021-07-14 ENCOUNTER — Encounter: Payer: Self-pay | Admitting: Emergency Medicine

## 2021-07-14 ENCOUNTER — Emergency Department
Admission: EM | Admit: 2021-07-14 | Discharge: 2021-07-14 | Disposition: A | Discharge status: Home / Self Care | Payer: Self-pay | Attending: Student in an Organized Health Care Education/Training Program | Admitting: Student in an Organized Health Care Education/Training Program

## 2021-07-14 ENCOUNTER — Other Ambulatory Visit: Payer: Self-pay

## 2021-07-14 DIAGNOSIS — B9689 Other specified bacterial agents as the cause of diseases classified elsewhere: Secondary | ICD-10-CM | POA: Insufficient documentation

## 2021-07-14 DIAGNOSIS — N760 Acute vaginitis: Secondary | ICD-10-CM | POA: Insufficient documentation

## 2021-07-14 DIAGNOSIS — J45909 Unspecified asthma, uncomplicated: Secondary | ICD-10-CM | POA: Insufficient documentation

## 2021-07-14 LAB — URINALYSIS, ROUTINE W REFLEX MICROSCOPIC
Bilirubin Urine: NEGATIVE
Glucose, UA: NEGATIVE mg/dL
Hgb urine dipstick: NEGATIVE
Ketones, ur: NEGATIVE mg/dL
Nitrite: NEGATIVE
Protein, ur: NEGATIVE mg/dL
Specific Gravity, Urine: 1.011 (ref 1.005–1.030)
pH: 6 (ref 5.0–8.0)

## 2021-07-14 LAB — WET PREP, GENITAL
Sperm: NONE SEEN
Trich, Wet Prep: NONE SEEN
WBC, Wet Prep HPF POC: >=10 — AB (ref <10)
Yeast Wet Prep HPF POC: NONE SEEN

## 2021-07-14 LAB — CHLAMYDIA/NGC RT PCR (ARMC ONLY)
Chlamydia Tr: NOT DETECTED
N gonorrhoeae: NOT DETECTED

## 2021-07-14 LAB — POC URINE PREG, ED: Preg Test, Ur: NEGATIVE

## 2021-07-14 MED ORDER — CLINDAMYCIN PHOSPHATE 2 % VA CREA: 1.0000 | TOPICAL_CREAM | Freq: Every day | VAGINAL | 0 refills | Status: DC

## 2021-07-14 NOTE — ED Triage Notes (Signed)
Pt reports she has vaginal discharge and foul odor, reports she was here a week ago to be seen but had to leave. Denies any urinary symptoms. Pt talks in complete sentences no distress noted

## 2021-07-14 NOTE — ED Notes (Signed)
Dc ppw provided to patient. followup and RX information reviewed. Pt provides verbal consent for dc at this time and declines vs at time of dc. Pt ambulatory to lobby alert and oriented. 

## 2021-07-14 NOTE — ED Provider Notes (Signed)
[facility] Provider Note    Event Date/Time   First MD Initiated Contact with Patient 07/14/21 1631     (approximate)   History   Vaginal Discharge   HPI  Christine Conway is a 31 y.o. female presents to the emergency department for treatment and evaluation of vaginal odor and discharge.  She was treated couple of weeks ago for bacterial vaginosis.  Symptoms have not improved despite finishing her medication.   Past Medical History:  Diagnosis Date   Asthma    as child     Physical Exam   Triage Vital Signs: ED Triage Vitals [07/14/21 1628]  Enc Vitals Group     BP 126/85     Pulse Rate 74     Resp 16     Temp 98.1 F (36.7 C)     Temp Source Oral     SpO2 100 %     Weight 192 lb (87.1 kg)     Height 5\' 9"  (1.753 m)     Head Circumference      Peak Flow      Pain Score 0     Pain Loc      Pain Edu?      Excl. in GC?     Most recent vital signs: Vitals:   07/14/21 1628  BP: 126/85  Pulse: 74  Resp: 16  Temp: 98.1 F (36.7 C)  SpO2: 100%    General: Awake, no distress.  CV:  Good peripheral perfusion.  Resp:  Normal effort.  Abd:  No distention.  Other:  Malodorous, thin discharge noted in vaginal vault.  Cervix is closed.  Scant amount of blood.   ED Results / Procedures / Treatments   Labs (all labs ordered are listed, but only abnormal results are displayed) Labs Reviewed  WET PREP, GENITAL - Abnormal; Notable for the following components:      Result Value   Clue Cells Wet Prep HPF POC PRESENT (*)    WBC, Wet Prep HPF POC >=10 (*)    All other components within normal limits  URINALYSIS, ROUTINE W REFLEX MICROSCOPIC - Abnormal; Notable for the following components:   Color, Urine YELLOW (*)    APPearance CLEAR (*)    Leukocytes,Ua TRACE (*)    Bacteria, UA RARE (*)    All other components within normal limits  CHLAMYDIA/NGC RT PCR ([facility] ONLY)            POC URINE PREG, ED     EKG  Not  indicated   RADIOLOGY  Not indicated  I have independently reviewed and interpreted imaging as well as reviewed report from radiology.  PROCEDURES:  Critical Care performed: No  Procedures   MEDICATIONS ORDERED IN ED:  Medications - No data to display   IMPRESSION / MDM / ASSESSMENT AND PLAN / ED COURSE   I reviewed the triage vital signs and the nursing notes.  Differential diagnosis includes, but is not limited to: Trichomonas, bacterial vaginosis, chlamydia  Patient's presentation is most consistent with acute complicated illness / injury requiring diagnostic workup.  31 year old female presenting to the emergency department for treatment and evaluation of malodorous vaginal discharge.  See HPI for further details.  Pelvic exam completed and swabs obtained and sent to the lab.  Clue cells present and wet prep results.  Chlamydia and gonorrhea testing is pending.  Patient denies concern for exposure to either.  Because she was recently treated with Flagyl and  symptoms did not improve, plan will be to use Cleocin vaginal cream and prescription was submitted to her pharmacy.  She was encouraged to follow-up with her primary care provider or gynecologist if not improving over the week.  If symptoms change or worsen and she is unable to get an appointment, she is to return to the emergency department.      FINAL CLINICAL IMPRESSION(S) / ED DIAGNOSES   Final diagnoses:  BV (bacterial vaginosis)     Rx / DC Orders   ED Discharge Orders          Ordered    clindamycin (CLEOCIN) 2 % vaginal cream  Daily at bedtime        07/14/21 1717             Note:  This document was prepared using Dragon voice recognition software and may include unintentional dictation errors.   [name], FNP 07/14/21 1735    Willy Eddy, MD 07/14/21 [phone]

## 2022-01-19 DIAGNOSIS — R509 Fever, unspecified: Secondary | ICD-10-CM | POA: Diagnosis not present

## 2022-01-19 DIAGNOSIS — J101 Influenza due to other identified influenza virus with other respiratory manifestations: Secondary | ICD-10-CM | POA: Diagnosis not present

## 2022-01-19 DIAGNOSIS — Z20822 Contact with and (suspected) exposure to covid-19: Secondary | ICD-10-CM | POA: Diagnosis not present

## 2022-01-30 ENCOUNTER — Emergency Department
Admission: EM | Admit: 2022-01-30 | Discharge: 2022-01-30 | Disposition: A | Discharge status: Home / Self Care | Payer: BC Managed Care – PPO | Attending: Emergency Medicine | Admitting: Emergency Medicine

## 2022-01-30 ENCOUNTER — Other Ambulatory Visit: Payer: Self-pay

## 2022-01-30 DIAGNOSIS — N898 Other specified noninflammatory disorders of vagina: Secondary | ICD-10-CM | POA: Diagnosis not present

## 2022-01-30 DIAGNOSIS — B3731 Acute candidiasis of vulva and vagina: Secondary | ICD-10-CM | POA: Insufficient documentation

## 2022-01-30 LAB — URINALYSIS, ROUTINE W REFLEX MICROSCOPIC
Bilirubin Urine: NEGATIVE
Glucose, UA: NEGATIVE mg/dL
Hgb urine dipstick: NEGATIVE
Ketones, ur: NEGATIVE mg/dL
Leukocytes,Ua: NEGATIVE
Nitrite: NEGATIVE
Protein, ur: NEGATIVE mg/dL
Specific Gravity, Urine: 1.002 — ABNORMAL LOW (ref 1.005–1.030)
pH: 7 (ref 5.0–8.0)

## 2022-01-30 LAB — CHLAMYDIA/NGC RT PCR (ARMC ONLY)
Chlamydia Tr: NOT DETECTED
N gonorrhoeae: NOT DETECTED

## 2022-01-30 LAB — POC URINE PREG, ED: Preg Test, Ur: NEGATIVE

## 2022-01-30 LAB — WET PREP, GENITAL
Clue Cells Wet Prep HPF POC: NONE SEEN
Sperm: NONE SEEN
Trich, Wet Prep: NONE SEEN
WBC, Wet Prep HPF POC: <10 (ref <10)

## 2022-01-30 MED ORDER — FLUCONAZOLE 50 MG PO TABS
150.0000 mg | ORAL_TABLET | Freq: Once | ORAL | Status: AC
  Administered 2022-01-30: 150 mg via ORAL
  Filled 2022-01-30: qty 1

## 2022-01-30 NOTE — ED Provider Notes (Signed)
[facility] Provider Note    Event Date/Time   First MD Initiated Contact with Patient 01/30/22 1027     (approximate)   History   Urinary Tract Infection   HPI  Christine Conway is a 32 y.o. female with a past medical history of bacterial vaginosis and obesity presents today for evaluation of 1 week of cream-colored vaginal discharge.  She denies pain or itchiness.  She reports that she is sexually active with 1 female partner  and she is unsure if he has been experiencing similar symptoms.  She has not had any abdominal pain, pelvic pain, nausea, vomiting or fevers.  Patient Active Problem List   Diagnosis Date Noted   Obesity BMI=31.7 02/09/2021          Physical Exam   Triage Vital Signs: ED Triage Vitals  Enc Vitals Group     BP 01/30/22 0900 115/84     Pulse Rate 01/30/22 0900 61     Resp 01/30/22 0900 16     Temp 01/30/22 0900 98.3 F (36.8 C)     Temp Source 01/30/22 0900 Oral     SpO2 01/30/22 0900 100 %     Weight 01/30/22 0858 192 lb 0.3 oz (87.1 kg)     Height 01/30/22 0858 5\' 9"  (1.753 m)     Head Circumference --      Peak Flow --      Pain Score 01/30/22 0858 0     Pain Loc --      Pain Edu? --      Excl. in [place]? --     Most recent vital signs: Vitals:   01/30/22 0900  BP: 115/84  Pulse: 61  Resp: 16  Temp: 98.3 F (36.8 C)  SpO2: 100%    Physical Exam Vitals and nursing note reviewed.  Constitutional:      General: Awake and alert. No acute distress.    Appearance: Normal appearance. The patient is normal weight.  HENT:     Head: Normocephalic and atraumatic.     Mouth: Mucous membranes are moist.  Eyes:     General: PERRL. Normal EOMs        Right eye: No discharge.        Left eye: No discharge.     Conjunctiva/sclera: Conjunctivae normal.  Cardiovascular:     Rate and Rhythm: Normal rate and regular rhythm.     Pulses: Normal pulses.     Heart sounds: Normal heart sounds Pulmonary:     Effort:  Pulmonary effort is normal. No respiratory distress.     Breath sounds: Normal breath sounds.  Abdominal:     Abdomen is soft. There is no abdominal tenderness. No rebound or guarding. No distention. Musculoskeletal:        General: No swelling. Normal range of motion.     Cervical back: Normal range of motion and neck supple.  Skin:    General: Skin is warm and dry.     Capillary Refill: Capillary refill takes less than 2 seconds.     Findings: No rash.  Neurological:     Mental Status: The patient is awake and alert.      ED Results / Procedures / Treatments   Labs (all labs ordered are listed, but only abnormal results are displayed) Labs Reviewed  WET PREP, GENITAL - Abnormal; Notable for the following components:      Result Value   Yeast Wet Prep HPF POC PRESENT (*)  All other components within normal limits  URINALYSIS, ROUTINE W REFLEX MICROSCOPIC - Abnormal; Notable for the following components:   Color, Urine COLORLESS (*)    APPearance CLEAR (*)    Specific Gravity, Urine 1.002 (*)    All other components within normal limits  CHLAMYDIA/NGC RT PCR ([facility] ONLY)            POC URINE PREG, ED     EKG     RADIOLOGY     PROCEDURES:  Critical Care performed:   Procedures   MEDICATIONS ORDERED IN ED: Medications  fluconazole (DIFLUCAN) tablet 150 mg (150 mg Oral Given 01/30/22 1151)     IMPRESSION / MDM / ASSESSMENT AND PLAN / ED COURSE  I reviewed the triage vital signs and the nursing notes.   Differential diagnosis includes, but is not limited to, bacterial vaginosis, UTI, gonorrhea, chlamydia, trichomonas, yeast.  Patient is alert, hemodynamically stable and afebrile.   I reviewed the patients chart. Patient has history of BV, most recently 07/14/21.  She has no abdominal tenderness or pelvic pain, I do not suspect PID or TOA.  She performed a self swab which was her preference and this was positive for yeast.  Patient was treated with Diflucan.   We discussed this diagnosis at length. We discussed strict return precautions and outpatient follow up. Patient understands and agrees with plan.    Patient's presentation is most consistent with acute complicated illness / injury requiring diagnostic workup.    FINAL CLINICAL IMPRESSION(S) / ED DIAGNOSES   Final diagnoses:  Yeast vaginitis     Rx / DC Orders   ED Discharge Orders     None        Note:  This document was prepared using Dragon voice recognition software and may include unintentional dictation errors.   Emeline Gins 01/30/22 1359    Nance Pear, MD 01/30/22 1450

## 2022-01-30 NOTE — ED Notes (Signed)
Spoke with pt at bedside. She denies urinary symptoms but states she came because has been having thick white vaginal discharge for 1 week, but not as thick as cottage cheese. Denies vaginal itching. Denies unpleasant odor or frothy appearance to discharge. States she often worries about her health and just wanted to make sure "everything is okay down there".

## 2022-01-30 NOTE — Discharge Instructions (Signed)
Your swab is positive for a vaginal yeast infection.  Your gonorrhea and Chlamydia and bacterial vaginosis is negative.  Trichomonas is negative.  You are given medicine to help with the yeast infection in the emergency department.  Please return for any new, worsening, or change in symptoms or other concerns.  It was a pleasure caring for you today.

## 2022-01-30 NOTE — ED Triage Notes (Signed)
C/O vaginal discharge x 1 week.  Describes odor to discharge.  Denies urinary symptoms.

## 2022-03-11 NOTE — Progress Notes (Signed)
BP 120/76   Pulse 90   Temp 98 F (36.7 C) (Oral)   Resp 18   Ht 5\' 9"  (1.753 m)   Wt 187 lb 11.2 oz (85.1 kg)   LMP 02/22/2022   SpO2 98%   BMI 27.72 kg/m    Subjective:    Patient ID: Christine Conway, female    DOB: Feb 15, 1990, 32 y.o.   MRN: [id number]  HPI: Christine Conway is a 32 y.o. female  Chief Complaint  Patient presents with   Establish Care   Migraine    Blurred vision   Establish care: her last physical was a couple months ago ( work physical) .  Medical history includes migraines, asthma.  Family history includes none.  Health maintenance had pap 03/13/2021 at health department, last year.   Migraines; she says she has had migraines since the age of 36.  She says this happened after a trauma.  She says she gets blurred vision.  She says it is usually on the right side of her head. She reports that she will some times nausea not ever time. She says she does not get an aura.   She says she takes Excedrin migraine or aleve when she has a migraine. She says sometimes it helps.  She says she gets about 3 migraines a month.  She says she has never been on migraine medication before. Will send in a prescription for imitrex to try as abortive therapy. She needs FMLA paperwork for her job due to leaving when she gets a migraine.    Asthma: she says she uses an albuterol inhaler as needed for her asthma. She says that she only needs it ever once in a while like during illness.  Will send in refill.   Relevant past medical, surgical, family and social history reviewed and updated as indicated. Interim medical history since our last visit reviewed. Allergies and medications reviewed and updated.  Review of Systems  Constitutional: Negative for fever or weight change.  Respiratory: Negative for cough and shortness of breath.   Cardiovascular: Negative for chest pain or palpitations.  Gastrointestinal: Negative for abdominal pain, no bowel changes.  Musculoskeletal: Negative  for gait problem or joint swelling.  Skin: Negative for rash.  Neurological: Negative for dizziness, positive for  headache.  No other specific complaints in a complete review of systems (except as listed in HPI above).      Objective:    BP 120/76   Pulse 90   Temp 98 F (36.7 C) (Oral)   Resp 18   Ht 5\' 9"  (1.753 m)   Wt 187 lb 11.2 oz (85.1 kg)   LMP 02/22/2022   SpO2 98%   BMI 27.72 kg/m   Wt Readings from Last 3 Encounters:  03/12/22 187 lb 11.2 oz (85.1 kg)  01/30/22 192 lb 0.3 oz (87.1 kg)  07/14/21 192 lb (87.1 kg)    Physical Exam  Constitutional: Patient appears well-developed and well-nourished.  No distress.  HEENT: head atraumatic, normocephalic, pupils equal and reactive to light, ears TMs clear, neck supple, throat within normal limits Cardiovascular: Normal rate, regular rhythm and normal heart sounds.  No murmur heard. No BLE edema. Pulmonary/Chest: Effort normal and breath sounds normal. No respiratory distress. Abdominal: Soft.  There is no tenderness. Psychiatric: Patient has a normal mood and affect. behavior is normal. Judgment and thought content normal.      Assessment & Plan:   Problem List Items Addressed This Visit  Cardiovascular and Mediastinum   Migraine without aura and without status migrainosus, not intractable - Primary    Start imitrex at onset of migraine, will fill out FMLA paperwork when patient provides it.       Relevant Medications   SUMAtriptan (IMITREX) 25 MG tablet     Respiratory   Mild intermittent asthma without complication    Albuterol refill sent in, condition stable      Relevant Medications   albuterol (VENTOLIN HFA) 108 (90 Base) MCG/ACT inhaler   Other Visit Diagnoses     Encounter to establish care       schedule cpe when due   Encounter for hepatitis C screening test for low risk patient       Relevant Orders   Hepatitis C antibody   Screening for HIV without presence of risk factors        Relevant Orders   HIV Antibody (routine testing w rflx)   Screening for diabetes mellitus       Relevant Orders   COMPLETE METABOLIC PANEL WITH GFR   Hemoglobin A1c   Screening for cholesterol level       Relevant Orders   Lipid panel   Screening for deficiency anemia       Relevant Orders   CBC with Differential/Platelet        Follow up plan: Return in about 6 months (around 09/12/2022) for follow up.

## 2022-03-12 ENCOUNTER — Ambulatory Visit (INDEPENDENT_AMBULATORY_CARE_PROVIDER_SITE_OTHER): Payer: BC Managed Care – PPO | Admitting: Nurse Practitioner

## 2022-03-12 ENCOUNTER — Other Ambulatory Visit: Payer: Self-pay

## 2022-03-12 ENCOUNTER — Encounter: Payer: Self-pay | Admitting: Nurse Practitioner

## 2022-03-12 VITALS — BP 120/76 | HR 90 | Temp 98.0°F | Resp 18 | Ht 69.0 in | Wt 187.7 lb

## 2022-03-12 DIAGNOSIS — Z1159 Encounter for screening for other viral diseases: Secondary | ICD-10-CM | POA: Diagnosis not present

## 2022-03-12 DIAGNOSIS — Z7689 Persons encountering health services in other specified circumstances: Secondary | ICD-10-CM | POA: Diagnosis not present

## 2022-03-12 DIAGNOSIS — Z13220 Encounter for screening for lipoid disorders: Secondary | ICD-10-CM | POA: Diagnosis not present

## 2022-03-12 DIAGNOSIS — Z114 Encounter for screening for human immunodeficiency virus [HIV]: Secondary | ICD-10-CM

## 2022-03-12 DIAGNOSIS — G43009 Migraine without aura, not intractable, without status migrainosus: Secondary | ICD-10-CM

## 2022-03-12 DIAGNOSIS — J4520 Mild intermittent asthma, uncomplicated: Secondary | ICD-10-CM

## 2022-03-12 DIAGNOSIS — Z131 Encounter for screening for diabetes mellitus: Secondary | ICD-10-CM | POA: Diagnosis not present

## 2022-03-12 DIAGNOSIS — Z130 Encounter for screening for diseases of the blood and blood-forming organs and certain disorders involving the immune mechanism: Secondary | ICD-10-CM

## 2022-03-12 MED ORDER — ALBUTEROL SULFATE HFA 108 (90 BASE) MCG/ACT IN AERS: 2.0000 | INHALATION_SPRAY | Freq: Four times a day (QID) | RESPIRATORY_TRACT | 5 refills | Status: DC | PRN

## 2022-03-12 MED ORDER — SUMATRIPTAN SUCCINATE 25 MG PO TABS: 25.0000 mg | ORAL_TABLET | ORAL | 0 refills | Status: DC | PRN

## 2022-03-12 NOTE — Assessment & Plan Note (Signed)
Start imitrex at onset of migraine, will fill out FMLA paperwork when patient provides it.

## 2022-03-12 NOTE — Assessment & Plan Note (Signed)
Albuterol refill sent in, condition stable

## 2022-03-13 LAB — COMPLETE METABOLIC PANEL WITH GFR
AG Ratio: 1.6 (calc) (ref 1.0–2.5)
ALT: 7 U/L (ref 6–29)
AST: 10 U/L (ref 10–30)
Albumin: 4.4 g/dL (ref 3.6–5.1)
Alkaline phosphatase (APISO): 39 U/L (ref 31–125)
BUN/Creatinine Ratio: 10 (calc) (ref 6–22)
BUN: 10 mg/dL (ref 7–25)
CO2: 26 mmol/L (ref 20–32)
Calcium: 9.2 mg/dL (ref 8.6–10.2)
Chloride: 104 mmol/L (ref 98–110)
Creat: 1.04 mg/dL — ABNORMAL HIGH (ref 0.50–0.96)
Globulin: 2.7 g/dL (calc) (ref 1.9–3.7)
Glucose, Bld: 70 mg/dL (ref 65–99)
Potassium: 4.3 mmol/L (ref 3.5–5.3)
Sodium: 139 mmol/L (ref 135–146)
Total Bilirubin: 0.5 mg/dL (ref 0.2–1.2)
Total Protein: 7.1 g/dL (ref 6.1–8.1)
eGFR: 75 mL/min/{1.73_m2} (ref >=60)

## 2022-03-13 LAB — CBC WITH DIFFERENTIAL/PLATELET
Absolute Monocytes: 335 cells/uL (ref 200–950)
Basophils Absolute: 39 cells/uL (ref 0–200)
Basophils Relative: 0.5 %
Eosinophils Absolute: 203 cells/uL (ref 15–500)
Eosinophils Relative: 2.6 %
HCT: 34.3 % — ABNORMAL LOW (ref 35.0–45.0)
Hemoglobin: 11.2 g/dL — ABNORMAL LOW (ref 11.7–15.5)
Lymphs Abs: 3159 cells/uL (ref 850–3900)
MCH: 27 pg (ref 27.0–33.0)
MCHC: 32.7 g/dL (ref 32.0–36.0)
MCV: 82.7 fL (ref 80.0–100.0)
MPV: 10.8 fL (ref 7.5–12.5)
Monocytes Relative: 4.3 %
Neutro Abs: 4064 cells/uL (ref 1500–7800)
Neutrophils Relative %: 52.1 %
Platelets: 268 10*3/uL (ref 140–400)
RBC: 4.15 10*6/uL (ref 3.80–5.10)
RDW: 15.7 % — ABNORMAL HIGH (ref 11.0–15.0)
Total Lymphocyte: 40.5 %
WBC: 7.8 10*3/uL (ref 3.8–10.8)

## 2022-03-13 LAB — HEMOGLOBIN A1C
Hgb A1c MFr Bld: 5.2 % of total Hgb (ref <5.7)
Mean Plasma Glucose: 103 mg/dL
eAG (mmol/L): 5.7 mmol/L

## 2022-03-13 LAB — HIV ANTIBODY (ROUTINE TESTING W REFLEX): HIV 1&2 Ab, 4th Generation: NONREACTIVE

## 2022-03-13 LAB — HEPATITIS C ANTIBODY: Hepatitis C Ab: NONREACTIVE

## 2022-03-15 ENCOUNTER — Encounter: Payer: Self-pay | Admitting: Nurse Practitioner

## 2022-03-18 ENCOUNTER — Telehealth: Payer: Self-pay | Admitting: Nurse Practitioner

## 2022-03-18 NOTE — Telephone Encounter (Signed)
Pt is requesting a call back from [place]. She missed the call. Please advise CB- [phone]

## 2022-03-19 NOTE — Telephone Encounter (Signed)
Sent message to patient  explaining Almyra Free will be back in office on Thursday and FMLA will be filled out at that time

## 2022-05-10 ENCOUNTER — Telehealth: Payer: Self-pay | Admitting: Nurse Practitioner

## 2022-05-10 NOTE — Telephone Encounter (Unsigned)
Copied from CRM [id number]. Topic: General - Other >> May 10, 2022 12:37 PM Jannifer Rodney M wrote: Reason for CRM: Pt stated the FMLA paperwork was faxed in to [facility] and she needs the paperwork to be updated. Pt requests that the FMLA paperwork be updated and faxed back asap. Cb# [phone]

## 2022-05-13 NOTE — Telephone Encounter (Signed)
Pt called make to state the diagnosis for FMLA is migraine Headaches.

## 2022-05-13 NOTE — Telephone Encounter (Signed)
Called patient to see what diagnosis is for [facility]

## 2022-05-14 NOTE — Telephone Encounter (Signed)
Paperwork faxed °

## 2022-06-08 DIAGNOSIS — R059 Cough, unspecified: Secondary | ICD-10-CM | POA: Diagnosis not present

## 2022-06-08 DIAGNOSIS — F17210 Nicotine dependence, cigarettes, uncomplicated: Secondary | ICD-10-CM | POA: Diagnosis not present

## 2022-06-08 DIAGNOSIS — Z20822 Contact with and (suspected) exposure to covid-19: Secondary | ICD-10-CM | POA: Diagnosis not present

## 2022-06-08 DIAGNOSIS — J069 Acute upper respiratory infection, unspecified: Secondary | ICD-10-CM | POA: Diagnosis not present

## 2022-06-08 DIAGNOSIS — F1290 Cannabis use, unspecified, uncomplicated: Secondary | ICD-10-CM | POA: Diagnosis not present

## 2022-06-08 DIAGNOSIS — R509 Fever, unspecified: Secondary | ICD-10-CM | POA: Diagnosis not present

## 2022-06-08 DIAGNOSIS — Z79899 Other long term (current) drug therapy: Secondary | ICD-10-CM | POA: Diagnosis not present

## 2022-08-14 ENCOUNTER — Ambulatory Visit: Payer: BC Managed Care – PPO | Admitting: Nurse Practitioner

## 2022-09-11 NOTE — Progress Notes (Deleted)
   There were no vitals taken for this visit.   Subjective:    Patient ID: Christine Conway, female    DOB: 19-Nov-1990, 32 y.o.   MRN: [id number]  HPI: Christine Conway is a 32 y.o. female  No chief complaint on file.  Establish care: her last physical was a couple months ago ( work physical) .  Medical history includes migraines, asthma.  Family history includes none.  Health maintenance had pap 03/13/2021 at health department, last year.   Migraines; she says she has had migraines since the age of 56.  She says this happened after a trauma.  She says she gets blurred vision.  She says it is usually on the right side of her head. She reports that she will some times nausea not ever time. She says she does not get an aura.   She says she takes Excedrin migraine or aleve when she has a migraine. She says sometimes it helps.  She says she gets about 3 migraines a month.  She says she has never been on migraine medication before. Will send in a prescription for imitrex to try as abortive therapy. She needs FMLA paperwork for her job due to leaving when she gets a migraine.    Asthma: she says she uses an albuterol inhaler as needed for her asthma. She says that she only needs it ever once in a while like during illness.  Will send in refill.   Relevant past medical, surgical, family and social history reviewed and updated as indicated. Interim medical history since our last visit reviewed. Allergies and medications reviewed and updated.  Review of Systems  Constitutional: Negative for fever or weight change.  Respiratory: Negative for cough and shortness of breath.   Cardiovascular: Negative for chest pain or palpitations.  Gastrointestinal: Negative for abdominal pain, no bowel changes.  Musculoskeletal: Negative for gait problem or joint swelling.  Skin: Negative for rash.  Neurological: Negative for dizziness, positive for  headache.  No other specific complaints in a complete review of  systems (except as listed in HPI above).      Objective:    There were no vitals taken for this visit.  Wt Readings from Last 3 Encounters:  03/12/22 187 lb 11.2 oz (85.1 kg)  01/30/22 192 lb 0.3 oz (87.1 kg)  07/14/21 192 lb (87.1 kg)    Physical Exam  Constitutional: Patient appears well-developed and well-nourished.  No distress.  HEENT: head atraumatic, normocephalic, pupils equal and reactive to light, ears TMs clear, neck supple, throat within normal limits Cardiovascular: Normal rate, regular rhythm and normal heart sounds.  No murmur heard. No BLE edema. Pulmonary/Chest: Effort normal and breath sounds normal. No respiratory distress. Abdominal: Soft.  There is no tenderness. Psychiatric: Patient has a normal mood and affect. behavior is normal. Judgment and thought content normal.      Assessment & Plan:   Problem List Items Addressed This Visit   None     Follow up plan: No follow-ups on file.

## 2022-09-12 ENCOUNTER — Ambulatory Visit: Payer: BC Managed Care – PPO | Admitting: Nurse Practitioner

## 2022-09-13 ENCOUNTER — Ambulatory Visit: Payer: BC Managed Care – PPO | Admitting: Nurse Practitioner

## 2022-09-13 NOTE — Progress Notes (Deleted)
   There were no vitals taken for this visit.   Subjective:    Patient ID: Christine Conway, female    DOB: 19-Nov-1990, 32 y.o.   MRN: [id number]  HPI: Christine Conway is a 32 y.o. female  No chief complaint on file.  Establish care: her last physical was a couple months ago ( work physical) .  Medical history includes migraines, asthma.  Family history includes none.  Health maintenance had pap 03/13/2021 at health department, last year.   Migraines; she says she has had migraines since the age of 56.  She says this happened after a trauma.  She says she gets blurred vision.  She says it is usually on the right side of her head. She reports that she will some times nausea not ever time. She says she does not get an aura.   She says she takes Excedrin migraine or aleve when she has a migraine. She says sometimes it helps.  She says she gets about 3 migraines a month.  She says she has never been on migraine medication before. Will send in a prescription for imitrex to try as abortive therapy. She needs FMLA paperwork for her job due to leaving when she gets a migraine.    Asthma: she says she uses an albuterol inhaler as needed for her asthma. She says that she only needs it ever once in a while like during illness.  Will send in refill.   Relevant past medical, surgical, family and social history reviewed and updated as indicated. Interim medical history since our last visit reviewed. Allergies and medications reviewed and updated.  Review of Systems  Constitutional: Negative for fever or weight change.  Respiratory: Negative for cough and shortness of breath.   Cardiovascular: Negative for chest pain or palpitations.  Gastrointestinal: Negative for abdominal pain, no bowel changes.  Musculoskeletal: Negative for gait problem or joint swelling.  Skin: Negative for rash.  Neurological: Negative for dizziness, positive for  headache.  No other specific complaints in a complete review of  systems (except as listed in HPI above).      Objective:    There were no vitals taken for this visit.  Wt Readings from Last 3 Encounters:  03/12/22 187 lb 11.2 oz (85.1 kg)  01/30/22 192 lb 0.3 oz (87.1 kg)  07/14/21 192 lb (87.1 kg)    Physical Exam  Constitutional: Patient appears well-developed and well-nourished.  No distress.  HEENT: head atraumatic, normocephalic, pupils equal and reactive to light, ears TMs clear, neck supple, throat within normal limits Cardiovascular: Normal rate, regular rhythm and normal heart sounds.  No murmur heard. No BLE edema. Pulmonary/Chest: Effort normal and breath sounds normal. No respiratory distress. Abdominal: Soft.  There is no tenderness. Psychiatric: Patient has a normal mood and affect. behavior is normal. Judgment and thought content normal.      Assessment & Plan:   Problem List Items Addressed This Visit   None     Follow up plan: No follow-ups on file.

## 2022-09-20 ENCOUNTER — Ambulatory Visit: Payer: BC Managed Care – PPO | Admitting: Nurse Practitioner

## 2022-09-20 NOTE — Progress Notes (Deleted)
   There were no vitals taken for this visit.   Subjective:    Patient ID: Christine Conway, female    DOB: 19-Nov-1990, 32 y.o.   MRN: [id number]  HPI: Christine Conway is a 32 y.o. female  No chief complaint on file.  Establish care: her last physical was a couple months ago ( work physical) .  Medical history includes migraines, asthma.  Family history includes none.  Health maintenance had pap 03/13/2021 at health department, last year.   Migraines; she says she has had migraines since the age of 56.  She says this happened after a trauma.  She says she gets blurred vision.  She says it is usually on the right side of her head. She reports that she will some times nausea not ever time. She says she does not get an aura.   She says she takes Excedrin migraine or aleve when she has a migraine. She says sometimes it helps.  She says she gets about 3 migraines a month.  She says she has never been on migraine medication before. Will send in a prescription for imitrex to try as abortive therapy. She needs FMLA paperwork for her job due to leaving when she gets a migraine.    Asthma: she says she uses an albuterol inhaler as needed for her asthma. She says that she only needs it ever once in a while like during illness.  Will send in refill.   Relevant past medical, surgical, family and social history reviewed and updated as indicated. Interim medical history since our last visit reviewed. Allergies and medications reviewed and updated.  Review of Systems  Constitutional: Negative for fever or weight change.  Respiratory: Negative for cough and shortness of breath.   Cardiovascular: Negative for chest pain or palpitations.  Gastrointestinal: Negative for abdominal pain, no bowel changes.  Musculoskeletal: Negative for gait problem or joint swelling.  Skin: Negative for rash.  Neurological: Negative for dizziness, positive for  headache.  No other specific complaints in a complete review of  systems (except as listed in HPI above).      Objective:    There were no vitals taken for this visit.  Wt Readings from Last 3 Encounters:  03/12/22 187 lb 11.2 oz (85.1 kg)  01/30/22 192 lb 0.3 oz (87.1 kg)  07/14/21 192 lb (87.1 kg)    Physical Exam  Constitutional: Patient appears well-developed and well-nourished.  No distress.  HEENT: head atraumatic, normocephalic, pupils equal and reactive to light, ears TMs clear, neck supple, throat within normal limits Cardiovascular: Normal rate, regular rhythm and normal heart sounds.  No murmur heard. No BLE edema. Pulmonary/Chest: Effort normal and breath sounds normal. No respiratory distress. Abdominal: Soft.  There is no tenderness. Psychiatric: Patient has a normal mood and affect. behavior is normal. Judgment and thought content normal.      Assessment & Plan:   Problem List Items Addressed This Visit   None     Follow up plan: No follow-ups on file.

## 2022-09-27 ENCOUNTER — Ambulatory Visit (INDEPENDENT_AMBULATORY_CARE_PROVIDER_SITE_OTHER): Payer: BC Managed Care – PPO | Admitting: Nurse Practitioner

## 2022-09-27 ENCOUNTER — Other Ambulatory Visit: Payer: Self-pay

## 2022-09-27 ENCOUNTER — Encounter: Payer: Self-pay | Admitting: Nurse Practitioner

## 2022-09-27 ENCOUNTER — Other Ambulatory Visit (HOSPITAL_COMMUNITY)
Admission: RE | Admit: 2022-09-27 | Discharge: 2022-09-27 | Disposition: A | Discharge status: Home / Self Care | Payer: BC Managed Care – PPO | Source: Ambulatory Visit | Attending: Nurse Practitioner | Admitting: Nurse Practitioner

## 2022-09-27 DIAGNOSIS — J4520 Mild intermittent asthma, uncomplicated: Secondary | ICD-10-CM | POA: Diagnosis not present

## 2022-09-27 DIAGNOSIS — R350 Frequency of micturition: Secondary | ICD-10-CM | POA: Insufficient documentation

## 2022-09-27 DIAGNOSIS — G43009 Migraine without aura, not intractable, without status migrainosus: Secondary | ICD-10-CM

## 2022-09-27 DIAGNOSIS — N926 Irregular menstruation, unspecified: Secondary | ICD-10-CM

## 2022-09-27 DIAGNOSIS — B9689 Other specified bacterial agents as the cause of diseases classified elsewhere: Secondary | ICD-10-CM | POA: Diagnosis not present

## 2022-09-27 DIAGNOSIS — N760 Acute vaginitis: Secondary | ICD-10-CM | POA: Diagnosis not present

## 2022-09-27 DIAGNOSIS — D649 Anemia, unspecified: Secondary | ICD-10-CM | POA: Insufficient documentation

## 2022-09-27 DIAGNOSIS — Z113 Encounter for screening for infections with a predominantly sexual mode of transmission: Secondary | ICD-10-CM | POA: Insufficient documentation

## 2022-09-27 LAB — POCT URINALYSIS DIPSTICK
Bilirubin, UA: NEGATIVE
Glucose, UA: NEGATIVE
Ketones, UA: NEGATIVE
Nitrite, UA: NEGATIVE
Protein, UA: POSITIVE — AB
Spec Grav, UA: 1.02 (ref 1.010–1.025)
Urobilinogen, UA: 0.2 E.U./dL
pH, UA: 6 (ref 5.0–8.0)

## 2022-09-27 LAB — POCT URINE PREGNANCY: Preg Test, Ur: NEGATIVE

## 2022-09-27 MED ORDER — DROSPIRENONE-ETHINYL ESTRADIOL 3-0.02 MG PO TABS
1.0000 | ORAL_TABLET | Freq: Every day | ORAL | 11 refills | Status: DC
Start: 2022-09-27 — End: 2023-09-29

## 2022-09-27 MED ORDER — CEPHALEXIN 500 MG PO CAPS: 500.0000 mg | ORAL_CAPSULE | Freq: Two times a day (BID) | ORAL | 0 refills | Status: AC

## 2022-09-27 NOTE — Assessment & Plan Note (Signed)
Repeat labs, start ocp for irregular heavy periods.

## 2022-09-27 NOTE — Assessment & Plan Note (Signed)
Continue to take imitrex as needed for migraine.

## 2022-09-27 NOTE — Assessment & Plan Note (Signed)
Stable, no changes

## 2022-09-27 NOTE — Progress Notes (Signed)
BP 120/80   Pulse 100   Temp 98.1 F (36.7 C) (Oral)   Resp 16   Ht 5\' 9"  (1.753 m)   Wt 199 lb 11.2 oz (90.6 kg)   LMP 09/14/2022   SpO2 99%   BMI 29.49 kg/m    Subjective:    Patient ID: Christine Conway, female    DOB: Dec 20, 1990, 32 y.o.   MRN: [id number]  HPI: Christine Conway is a 32 y.o. female  Chief Complaint  Patient presents with   Headache   Urinary Tract Infection    Migraines; she says she has had migraines since the age of 89.  She says this happened after a trauma.  She says she gets blurred vision.  She says it is usually on the right side of her head. She reports that she will some times nausea not ever time. She says she does not get an aura.   She says she takes Excedrin migraine or aleve when she has a migraine. She says sometimes it helps.  She reported getting about 3 migraines a month at last visit.  She says she has never been on migraine medication before.prescribed her imitrex to try as abortive therapy at last appointment. Filled out FMLA paperwork for her job due to leaving when she gets a migraine.  She reports she is doing well. Imitrex is helping.    Asthma: she says she uses an albuterol inhaler as needed for her asthma. She says that she only needs it ever once in a while like during illness.  No changes.   Anemia:  her last cbc showed slight anemia.  Will recheck today and get iron panel.  She does have heavy periods. She reports she has an irregular cycle. She has previously on depo. Will place referral to GYN. Discussed starting OCP she is in agreement. Will start and get poc hcg.  Which was negative.      Latest Ref Rng & Units 03/12/2022    2:53 PM 04/23/2021    7:35 PM 05/30/2014    1:10 AM  CBC  WBC 3.8 - 10.8 Thousand/uL 7.8  10.5  11.0   Hemoglobin 11.7 - 15.5 g/dL 56.4  33.2  95.1   Hematocrit 35.0 - 45.0 % 34.3  36.4  39.9   Platelets 140 - 400 Thousand/uL 268  280  215     URINARY SYMPTOMS  Dysuria: no Urinary frequency:  yes Urgency: yes Small volume voids: no Symptom severity: mild Urinary incontinence: no Foul odor: yes Hematuria: no Abdominal pain: no Back pain: no Suprapubic pain/pressure: no Flank pain: no Fever:  no Vomiting: no Relief with cranberry juice: no Relief with pyridium: no Status: same Previous urinary tract infection: yes Recurrent urinary tract infection: no Sexual activity: monogomous Treatments attempted:  monistat    Urine dip positive for blood, protein, leukocytes, will get vaginal swab, patient reports vaginal discharge yellow thick discharge.  Start keflex.    Relevant past medical, surgical, family and social history reviewed and updated as indicated. Interim medical history since our last visit reviewed. Allergies and medications reviewed and updated.  Review of Systems  Constitutional: Negative for fever or weight change.  Respiratory: Negative for cough and shortness of breath.   Cardiovascular: Negative for chest pain or palpitations.  Gastrointestinal: Negative for abdominal pain, no bowel changes.  Musculoskeletal: Negative for gait problem or joint swelling.  Skin: Negative for rash.  Neurological: Negative for dizziness, positive for  headache.  No other  specific complaints in a complete review of systems (except as listed in HPI above).      Objective:    BP 120/80   Pulse 100   Temp 98.1 F (36.7 C) (Oral)   Resp 16   Ht 5\' 9"  (1.753 m)   Wt 199 lb 11.2 oz (90.6 kg)   LMP 09/14/2022   SpO2 99%   BMI 29.49 kg/m   Wt Readings from Last 3 Encounters:  09/27/22 199 lb 11.2 oz (90.6 kg)  03/12/22 187 lb 11.2 oz (85.1 kg)  01/30/22 192 lb 0.3 oz (87.1 kg)    Physical Exam  Constitutional: Patient appears well-developed and well-nourished.  No distress.  HEENT: head atraumatic, normocephalic, pupils equal and reactive to light, ears TMs clear, neck supple, throat within normal limits Cardiovascular: Normal rate, regular rhythm and normal heart  sounds.  No murmur heard. No BLE edema. Pulmonary/Chest: Effort normal and breath sounds normal. No respiratory distress. Abdominal: Soft.  There is no tenderness. No CVA tenderness.  Psychiatric: Patient has a normal mood and affect. behavior is normal. Judgment and thought content normal.      Assessment & Plan:   Problem List Items Addressed This Visit       Cardiovascular and Mediastinum   Migraine without aura and without status migrainosus, not intractable    Continue to take imitrex as needed for migraine.         Respiratory   Mild intermittent asthma without complication    Stable, no changes        Other   Anemia    Repeat labs, start ocp for irregular heavy periods.       Relevant Medications   drospirenone-ethinyl estradiol (YAZ) 3-0.02 MG tablet   Other Relevant Orders   CBC with Differential/Platelet   Vitamin B12   Iron, TIBC and Ferritin Panel   Ambulatory referral to Gynecology   Other Visit Diagnoses     Urinary frequency       start keflex,  push fluids, urine sent for culture   Relevant Medications   cephALEXin (KEFLEX) 500 MG capsule   Other Relevant Orders   POCT urinalysis dipstick (Completed)   Urine Culture   POCT urine pregnancy (Completed)   Cervicovaginal ancillary only   Irregular periods       start OCP.  negative pregnancy test,  referral placed to gyn   Relevant Medications   drospirenone-ethinyl estradiol (YAZ) 3-0.02 MG tablet   Other Relevant Orders   Ambulatory referral to Gynecology   POCT urine pregnancy (Completed)   Cervicovaginal ancillary only         Follow up plan: Return in about 6 months (around 03/27/2023) for follow up.

## 2022-09-28 LAB — CBC WITH DIFFERENTIAL/PLATELET
Absolute Monocytes: 367 cells/uL (ref 200–950)
Basophils Absolute: 66 cells/uL (ref 0–200)
Basophils Relative: 0.7 %
Eosinophils Absolute: 329 cells/uL (ref 15–500)
Eosinophils Relative: 3.5 %
HCT: 36.3 % (ref 35.0–45.0)
Hemoglobin: 11.4 g/dL — ABNORMAL LOW (ref 11.7–15.5)
Lymphs Abs: 3083 cells/uL (ref 850–3900)
MCH: 25.8 pg — ABNORMAL LOW (ref 27.0–33.0)
MCHC: 31.4 g/dL — ABNORMAL LOW (ref 32.0–36.0)
MCV: 82.1 fL (ref 80.0–100.0)
MPV: 11.2 fL (ref 7.5–12.5)
Monocytes Relative: 3.9 %
Neutro Abs: 5555 cells/uL (ref 1500–7800)
Neutrophils Relative %: 59.1 %
Platelets: 307 10*3/uL (ref 140–400)
RBC: 4.42 10*6/uL (ref 3.80–5.10)
RDW: 15.7 % — ABNORMAL HIGH (ref 11.0–15.0)
Total Lymphocyte: 32.8 %
WBC: 9.4 10*3/uL (ref 3.8–10.8)

## 2022-09-28 LAB — IRON,TIBC AND FERRITIN PANEL
%SAT: 10 % (calc) — ABNORMAL LOW (ref 16–45)
Ferritin: 4 ng/mL — ABNORMAL LOW (ref 16–154)
Iron: 45 ug/dL (ref 40–190)
TIBC: 431 mcg/dL (calc) (ref 250–450)

## 2022-09-28 LAB — URINE CULTURE
MICRO NUMBER:: 15496526
Result:: NO GROWTH
SPECIMEN QUALITY:: ADEQUATE

## 2022-09-28 LAB — VITAMIN B12: Vitamin B-12: 391 pg/mL (ref 200–1100)

## 2022-09-30 ENCOUNTER — Other Ambulatory Visit: Payer: Self-pay | Admitting: Nurse Practitioner

## 2022-09-30 DIAGNOSIS — D500 Iron deficiency anemia secondary to blood loss (chronic): Secondary | ICD-10-CM

## 2022-09-30 MED ORDER — IRON (FERROUS SULFATE) 325 (65 FE) MG PO TABS
325.0000 mg | ORAL_TABLET | Freq: Every day | ORAL | 1 refills | Status: DC
Start: 2022-09-30 — End: 2023-09-29

## 2022-10-01 LAB — CERVICOVAGINAL ANCILLARY ONLY
Bacterial Vaginitis (gardnerella): POSITIVE — AB
Candida Glabrata: NEGATIVE
Candida Vaginitis: NEGATIVE
Chlamydia: NEGATIVE
Comment: NEGATIVE
Comment: NEGATIVE
Comment: NEGATIVE
Comment: NEGATIVE
Comment: NEGATIVE
Comment: NORMAL
Neisseria Gonorrhea: NEGATIVE
Trichomonas: NEGATIVE

## 2022-10-02 ENCOUNTER — Other Ambulatory Visit: Payer: Self-pay | Admitting: Nurse Practitioner

## 2022-10-02 ENCOUNTER — Telehealth: Payer: Self-pay | Admitting: *Deleted

## 2022-10-02 DIAGNOSIS — B9689 Other specified bacterial agents as the cause of diseases classified elsewhere: Secondary | ICD-10-CM

## 2022-10-02 MED ORDER — METRONIDAZOLE 500 MG PO TABS: 500.0000 mg | ORAL_TABLET | Freq: Two times a day (BID) | ORAL | 0 refills | Status: AC

## 2022-10-02 NOTE — Telephone Encounter (Signed)
Pt given lab results per notes of J. Zane Herald , FNP from 10/02/22 on 10/02/22. Pt verbalized understanding. Recommended patient could eat probiotic yogurt in attempts to decrease chance of yeast infection from starting . Patient concerned BV a STI and reviewed it is not.

## 2022-10-18 ENCOUNTER — Encounter: Payer: BC Managed Care – PPO | Admitting: Licensed Practical Nurse

## 2022-10-25 ENCOUNTER — Ambulatory Visit (INDEPENDENT_AMBULATORY_CARE_PROVIDER_SITE_OTHER): Payer: BC Managed Care – PPO | Admitting: Licensed Practical Nurse

## 2022-10-25 VITALS — BP 119/70 | HR 78 | Ht 69.0 in | Wt 199.6 lb

## 2022-10-25 DIAGNOSIS — N921 Excessive and frequent menstruation with irregular cycle: Secondary | ICD-10-CM

## 2022-10-25 NOTE — Progress Notes (Signed)
   CHIEF COMPLAINT:  heavy menstrual cycle    Subjective  Here for heavy menstrual cycles Cycles are irregular, they occur every 34 days (sometimes will skip a cycle), last 5 days and are heavy, sometimes passes clots, changes products (pad and tampon together) every 2-3 hours. Denies cramping. Menarche age 32. Heavy periods have been happening for a number of years.She has  never been told she has fibroids or PCOS. Denies any changes to her voice, excessive facial hair or female pattern baldness. She doe shave hx anemia. She was recently started on Yaz by her PCP to address the issue. She is here today because a referral was made. She would prefer to first try Yaz, it has only been 3 weeks of starting it. She may consider other options if she does not see an improvement in her bleeding profile.      Objective   Examination:  General exam: Appears calm and comfortable  Respiratory system: Respiratory effort normal. Psychiatry: Judgement and insight appear normal. Mood & affect appropriate.   PE not necessary for purpose of visit, pt in agreement   VITALS:  height is 5\' 9"  (1.753 m) and weight is 199 lb 9.6 oz (90.5 kg). Her blood pressure is 119/70 and her pulse is 78.      Assessment/Plan:  Heavy menstrual bleeding  -will continue on Yaz. Reviewed other options such as Depo, IUD, and Nexplanon, or switching to continuous use pills.  -Pt will return if she has any further questions/ desires a different treatment.   Carie Caddy, CNM  [facility]  10/29/22  4:24 PM

## 2022-10-28 ENCOUNTER — Encounter: Payer: Self-pay | Admitting: Nurse Practitioner

## 2022-10-28 DIAGNOSIS — R059 Cough, unspecified: Secondary | ICD-10-CM | POA: Diagnosis not present

## 2022-10-28 DIAGNOSIS — Z1152 Encounter for screening for COVID-19: Secondary | ICD-10-CM | POA: Diagnosis not present

## 2022-10-28 DIAGNOSIS — F17210 Nicotine dependence, cigarettes, uncomplicated: Secondary | ICD-10-CM | POA: Diagnosis not present

## 2022-10-28 DIAGNOSIS — J069 Acute upper respiratory infection, unspecified: Secondary | ICD-10-CM | POA: Diagnosis not present

## 2022-10-29 DIAGNOSIS — N920 Excessive and frequent menstruation with regular cycle: Secondary | ICD-10-CM | POA: Insufficient documentation

## 2022-11-04 NOTE — Progress Notes (Unsigned)
   LMP 10/18/2022 (Exact Date)    Subjective:    Patient ID: Christine Conway, female    DOB: 18-Feb-1990, 32 y.o.   MRN: [id number]  HPI: Christine Conway is a 32 y.o. female  No chief complaint on file.   Migraines; she says she has had migraines since the age of 64.  She says this happened after a trauma.  She says she gets blurred vision.  She says it is usually on the right side of her head. She reports that she will some times nausea not ever time. She says she does not get an aura.   She says has previously taken Excedrin migraine or aleve when she has a migraine.  Filled out FMLA paperwork for her job due to leaving when she gets a migraine at last OV.  She reports she is doing well. Imitrex is helping.  Today patient reports ***   Relevant past medical, surgical, family and social history reviewed and updated as indicated. Interim medical history since our last visit reviewed. Allergies and medications reviewed and updated.  Review of Systems  Constitutional: Negative for fever or weight change.  Respiratory: Negative for cough and shortness of breath.   Cardiovascular: Negative for chest pain or palpitations.  Gastrointestinal: Negative for abdominal pain, no bowel changes.  Musculoskeletal: Negative for gait problem or joint swelling.  Skin: Negative for rash.  Neurological: Negative for dizziness, positive for  headache.  No other specific complaints in a complete review of systems (except as listed in HPI above).      Objective:    LMP 10/18/2022 (Exact Date)   Wt Readings from Last 3 Encounters:  10/25/22 199 lb 9.6 oz (90.5 kg)  09/27/22 199 lb 11.2 oz (90.6 kg)  03/12/22 187 lb 11.2 oz (85.1 kg)    Physical Exam  Constitutional: Patient appears well-developed and well-nourished.  No distress.  HEENT: head atraumatic, normocephalic, pupils equal and reactive to light, ears TMs clear, neck supple, throat within normal limits Cardiovascular: Normal rate, regular  rhythm and normal heart sounds.  No murmur heard. No BLE edema. Pulmonary/Chest: Effort normal and breath sounds normal. No respiratory distress. Abdominal: Soft.  There is no tenderness. No CVA tenderness.  Psychiatric: Patient has a normal mood and affect. behavior is normal. Judgment and thought content normal.      Assessment & Plan:   Problem List Items Addressed This Visit   None      Follow up plan: No follow-ups on file.

## 2022-11-05 ENCOUNTER — Ambulatory Visit (INDEPENDENT_AMBULATORY_CARE_PROVIDER_SITE_OTHER): Payer: BC Managed Care – PPO | Admitting: Nurse Practitioner

## 2022-11-05 ENCOUNTER — Encounter: Payer: Self-pay | Admitting: Nurse Practitioner

## 2022-11-05 ENCOUNTER — Other Ambulatory Visit: Payer: Self-pay

## 2022-11-05 VITALS — BP 120/72 | HR 94 | Temp 98.0°F | Resp 16 | Ht 69.0 in | Wt 203.0 lb

## 2022-11-05 DIAGNOSIS — Z0289 Encounter for other administrative examinations: Secondary | ICD-10-CM

## 2022-11-05 DIAGNOSIS — G43009 Migraine without aura, not intractable, without status migrainosus: Secondary | ICD-10-CM | POA: Diagnosis not present

## 2022-11-05 MED ORDER — PROPRANOLOL HCL 20 MG PO TABS
20.0000 mg | ORAL_TABLET | Freq: Two times a day (BID) | ORAL | 1 refills | Status: DC
Start: 2022-11-05 — End: 2023-03-27

## 2022-11-05 MED ORDER — UBRELVY 50 MG PO TABS
50.0000 mg | ORAL_TABLET | ORAL | 0 refills | Status: DC | PRN
Start: 2022-11-05 — End: 2023-01-13

## 2022-11-05 NOTE — Assessment & Plan Note (Signed)
Patient reports increased migraines. She says the imitrex is no longer helping. Will discontinue, start ubrelvy for abortive therapy,   propanolol 20 mg BID for preventative. Referral placed to neurology

## 2022-11-06 NOTE — Telephone Encounter (Signed)
Copied from CRM [id number]. Topic: General - Other >> Nov 06, 2022 11:22 AM Turkey B wrote: Reason for CRM: Sharol Roussel from [place], says need more info on how long pt is allowed to be out. She mentioned something about is the  pt being out 15 days a month. She says it is odd the pt does this frequently on Mondays and Fridays

## 2022-11-22 ENCOUNTER — Ambulatory Visit (INDEPENDENT_AMBULATORY_CARE_PROVIDER_SITE_OTHER): Payer: BC Managed Care – PPO | Admitting: Nurse Practitioner

## 2022-11-22 ENCOUNTER — Encounter: Payer: Self-pay | Admitting: Nurse Practitioner

## 2022-11-22 ENCOUNTER — Other Ambulatory Visit: Payer: Self-pay

## 2022-11-22 ENCOUNTER — Other Ambulatory Visit (HOSPITAL_COMMUNITY)
Admission: RE | Admit: 2022-11-22 | Discharge: 2022-11-22 | Disposition: A | Discharge status: Home / Self Care | Payer: BC Managed Care – PPO | Source: Ambulatory Visit | Attending: Nurse Practitioner | Admitting: Nurse Practitioner

## 2022-11-22 VITALS — BP 118/72 | HR 88 | Temp 98.0°F | Resp 18 | Ht 69.0 in | Wt 200.3 lb

## 2022-11-22 DIAGNOSIS — N760 Acute vaginitis: Secondary | ICD-10-CM | POA: Diagnosis not present

## 2022-11-22 DIAGNOSIS — N898 Other specified noninflammatory disorders of vagina: Secondary | ICD-10-CM | POA: Diagnosis not present

## 2022-11-22 DIAGNOSIS — B9689 Other specified bacterial agents as the cause of diseases classified elsewhere: Secondary | ICD-10-CM | POA: Diagnosis not present

## 2022-11-22 DIAGNOSIS — B3731 Acute candidiasis of vulva and vagina: Secondary | ICD-10-CM | POA: Insufficient documentation

## 2022-11-22 DIAGNOSIS — B379 Candidiasis, unspecified: Secondary | ICD-10-CM | POA: Diagnosis not present

## 2022-11-22 LAB — POCT URINALYSIS DIPSTICK
Bilirubin, UA: NEGATIVE
Blood, UA: NEGATIVE
Glucose, UA: NEGATIVE
Ketones, UA: NEGATIVE
Nitrite, UA: NEGATIVE
Protein, UA: NEGATIVE
Spec Grav, UA: 1.02 (ref 1.010–1.025)
Urobilinogen, UA: 0.2 U/dL
pH, UA: 5 (ref 5.0–8.0)

## 2022-11-22 NOTE — Progress Notes (Signed)
   BP 118/72   Pulse 88   Temp 98 F (36.7 C) (Oral)   Resp 18   Ht 5\' 9"  (1.753 m)   Wt 200 lb 4.8 oz (90.9 kg)   LMP 10/18/2022 (Exact Date)   SpO2 98%   BMI 29.58 kg/m    Subjective:    Patient ID: Christine Conway, female    DOB: 1990-07-03, 32 y.o.   MRN: [id number]  HPI: Christine Conway is a 32 y.o. female  Chief Complaint  Patient presents with   Vaginitis    odor   VAGINAL DISCHARGE Duration: 2 weeks Discharge description: yellow  Pruritus: no Dysuria: no Malodorous: yes Urinary frequency: no Fevers: no Abdominal pain: no  Sexual activity: monogamous History of sexually transmitted diseases: no Recent antibiotic use: no  Obtained vaginal swab, urine dip showed small leukocytes.  Will send for culture  Relevant past medical, surgical, family and social history reviewed and updated as indicated. Interim medical history since our last visit reviewed. Allergies and medications reviewed and updated.  Review of Systems  Ten systems reviewed and is negative except as mentioned in HPI       Objective:    BP 118/72   Pulse 88   Temp 98 F (36.7 C) (Oral)   Resp 18   Ht 5\' 9"  (1.753 m)   Wt 200 lb 4.8 oz (90.9 kg)   LMP 10/18/2022 (Exact Date)   SpO2 98%   BMI 29.58 kg/m   Wt Readings from Last 3 Encounters:  11/22/22 200 lb 4.8 oz (90.9 kg)  11/05/22 203 lb (92.1 kg)  10/25/22 199 lb 9.6 oz (90.5 kg)    Physical Exam  Constitutional: Patient appears well-developed and well-nourished.  No distress.  HEENT: head atraumatic, normocephalic, pupils equal and reactive to light, neck supple Cardiovascular: Normal rate, regular rhythm and normal heart sounds.  No murmur heard. No BLE edema. Pulmonary/Chest: Effort normal and breath sounds normal. No respiratory distress. Abdominal: Soft.  There is no tenderness. Psychiatric: Patient has a normal mood and affect. behavior is normal. Judgment and thought content normal.  Results for orders placed or  performed in visit on 11/22/22  POCT urinalysis dipstick  Result Value Ref Range   Color, UA gold    Clarity, UA cloudy    Glucose, UA Negative Negative   Bilirubin, UA negative    Ketones, UA negative    Spec Grav, UA 1.020 1.010 - 1.025   Blood, UA negative    pH, UA 5.0 5.0 - 8.0   Protein, UA Negative Negative   Urobilinogen, UA 0.2 0.2 or 1.0 E.U./dL   Nitrite, UA negative    Leukocytes, UA Small (1+) (A) Negative   Appearance cloudy    Odor small       Assessment & Plan:   Problem List Items Addressed This Visit   None Visit Diagnoses     Vaginal discharge    -  Primary   vagina swab collected and sent to lab, ua sent for culture   Relevant Orders   Cervicovaginal ancillary only   POCT urinalysis dipstick (Completed)   Urine Culture        Follow up plan: Return if symptoms worsen or fail to improve.

## 2022-11-23 LAB — URINE CULTURE
MICRO NUMBER:: 15737150
Result:: NO GROWTH
SPECIMEN QUALITY:: ADEQUATE

## 2022-11-25 ENCOUNTER — Encounter: Payer: Self-pay | Admitting: Nurse Practitioner

## 2022-11-26 ENCOUNTER — Encounter: Payer: Self-pay | Admitting: Nurse Practitioner

## 2022-11-26 ENCOUNTER — Other Ambulatory Visit: Payer: Self-pay | Admitting: Nurse Practitioner

## 2022-11-26 DIAGNOSIS — B379 Candidiasis, unspecified: Secondary | ICD-10-CM

## 2022-11-26 DIAGNOSIS — B9689 Other specified bacterial agents as the cause of diseases classified elsewhere: Secondary | ICD-10-CM

## 2022-11-26 LAB — CERVICOVAGINAL ANCILLARY ONLY
Bacterial Vaginitis (gardnerella): POSITIVE — AB
Candida Glabrata: NEGATIVE
Candida Vaginitis: POSITIVE — AB
Chlamydia: NEGATIVE
Comment: NEGATIVE
Comment: NEGATIVE
Comment: NEGATIVE
Comment: NEGATIVE
Comment: NORMAL
Neisseria Gonorrhea: NEGATIVE

## 2022-11-26 MED ORDER — METRONIDAZOLE 500 MG PO TABS: 500.0000 mg | ORAL_TABLET | Freq: Two times a day (BID) | ORAL | 0 refills | Status: AC

## 2022-11-26 MED ORDER — FLUCONAZOLE 150 MG PO TABS: 150.0000 mg | ORAL_TABLET | ORAL | 0 refills | Status: DC | PRN

## 2022-11-27 ENCOUNTER — Other Ambulatory Visit: Payer: Self-pay | Admitting: Nurse Practitioner

## 2022-11-27 DIAGNOSIS — B379 Candidiasis, unspecified: Secondary | ICD-10-CM

## 2022-11-27 MED ORDER — FLUCONAZOLE 150 MG PO TABS: 150.0000 mg | ORAL_TABLET | ORAL | 1 refills | Status: AC | PRN

## 2023-01-10 ENCOUNTER — Other Ambulatory Visit: Payer: Self-pay | Admitting: Nurse Practitioner

## 2023-01-10 DIAGNOSIS — G43009 Migraine without aura, not intractable, without status migrainosus: Secondary | ICD-10-CM

## 2023-01-13 NOTE — Telephone Encounter (Signed)
 Requested medication (s) are due for refill today - yes  Requested medication (s) are on the active medication list -yes  Future visit scheduled -yes  Last refill: 11/05/22  #30  Notes to clinic: off protocol- provider review   Requested Prescriptions  Pending Prescriptions Disp Refills   UBRELVY  50 MG TABS [Pharmacy Med Name: Ubrelvy  50 MG Oral Tablet] 15 tablet 0    Sig: TAKE 1 TABLET BY MOUTH AS NEEDED FOR MIGRAINE HEADACHE     Off-Protocol Failed - 01/13/2023 11:43 AM      Failed - Medication not assigned to a protocol, review manually.      Passed - Valid encounter within last 12 months    Recent Outpatient Visits           1 month ago Vaginal discharge   [facility] Gareth Clarity F, FNP   2 months ago Migraine without aura and without status migrainosus, not intractable   [facility] Gareth Clarity F, FNP   3 months ago Migraine without aura and without status migrainosus, not intractable   [facility] Gareth Clarity F, FNP   10 months ago Migraine without aura and without status migrainosus, not intractable   [facility] Gareth Clarity FALCON, FNP       Future Appointments             In 2 months Gareth, Clarity FALCON, FNP [facility], [facility]   In 3 months Gareth, Clarity FALCON, FNP [facility], [facility]               Requested Prescriptions  Pending Prescriptions Disp Refills   UBRELVY  50 MG TABS [Pharmacy Med Name: Ubrelvy  50 MG Oral Tablet] 15 tablet 0    Sig: TAKE 1 TABLET BY MOUTH AS NEEDED FOR MIGRAINE HEADACHE     Off-Protocol Failed - 01/13/2023 11:43 AM      Failed - Medication not assigned to a protocol, review manually.      Passed - Valid encounter within last 12 months    Recent Outpatient Visits           1 month ago Vaginal discharge   [facility] Gareth Clarity F, FNP   2 months  ago Migraine without aura and without status migrainosus, not intractable   [facility] Gareth Clarity F, FNP   3 months ago Migraine without aura and without status migrainosus, not intractable   [facility] Gareth Clarity FALCON, FNP   10 months ago Migraine without aura and without status migrainosus, not intractable   [facility] Gareth Clarity FALCON, FNP       Future Appointments             In 2 months Gareth, Clarity FALCON, FNP [facility], [facility]   In 3 months Gareth, Clarity FALCON, FNP [facility], [facility]

## 2023-03-27 ENCOUNTER — Other Ambulatory Visit: Payer: Self-pay | Admitting: Nurse Practitioner

## 2023-03-27 ENCOUNTER — Ambulatory Visit: Payer: BC Managed Care – PPO | Admitting: Nurse Practitioner

## 2023-03-27 ENCOUNTER — Encounter: Payer: Self-pay | Admitting: Nurse Practitioner

## 2023-03-27 ENCOUNTER — Other Ambulatory Visit (HOSPITAL_COMMUNITY)
Admission: RE | Admit: 2023-03-27 | Discharge: 2023-03-27 | Disposition: A | Discharge status: Home / Self Care | Source: Ambulatory Visit | Attending: Nurse Practitioner | Admitting: Nurse Practitioner

## 2023-03-27 VITALS — BP 126/78 | HR 78 | Temp 98.2°F | Resp 18 | Ht 69.0 in | Wt 209.1 lb

## 2023-03-27 DIAGNOSIS — B379 Candidiasis, unspecified: Secondary | ICD-10-CM

## 2023-03-27 DIAGNOSIS — J4520 Mild intermittent asthma, uncomplicated: Secondary | ICD-10-CM

## 2023-03-27 DIAGNOSIS — N912 Amenorrhea, unspecified: Secondary | ICD-10-CM | POA: Diagnosis not present

## 2023-03-27 DIAGNOSIS — N898 Other specified noninflammatory disorders of vagina: Secondary | ICD-10-CM

## 2023-03-27 DIAGNOSIS — G43009 Migraine without aura, not intractable, without status migrainosus: Secondary | ICD-10-CM

## 2023-03-27 DIAGNOSIS — Z0289 Encounter for other administrative examinations: Secondary | ICD-10-CM

## 2023-03-27 DIAGNOSIS — Z01419 Encounter for gynecological examination (general) (routine) without abnormal findings: Secondary | ICD-10-CM

## 2023-03-27 LAB — POCT URINE PREGNANCY: Preg Test, Ur: NEGATIVE

## 2023-03-27 MED ORDER — ALBUTEROL SULFATE HFA 108 (90 BASE) MCG/ACT IN AERS: 2.0000 | INHALATION_SPRAY | Freq: Four times a day (QID) | RESPIRATORY_TRACT | 5 refills | Status: AC | PRN

## 2023-03-27 NOTE — Progress Notes (Signed)
 BP 126/78   Pulse 78   Temp 98.2 F (36.8 C)   Resp 18   Ht 5\' 9"  (1.753 m)   Wt 209 lb 1.6 oz (94.8 kg)   LMP 02/21/2023   SpO2 96%   BMI 30.88 kg/m    Subjective:    Patient ID: Christine Conway, female    DOB: 01/05/1991, 33 y.o.   MRN: [id number]  HPI: Christine Conway is a 33 y.o. female  Chief Complaint  Patient presents with   Medical Management of Chronic Issues    W/ FMLA paperwork   Migraine   Menstrual Problem    Has been trying to get pregnant for 1 yr needs referral to GYN for fertility testing    Discussed the use of AI scribe software for clinical note transcription with the patient, who gave verbal consent to proceed.  History of Present Illness Christine Conway is a 33 year old female who presents for FMLA paperwork due to migraines. She was referred to neurology for further evaluation of her migraines.  She experiences approximately seven migraines per month, which necessitate her leaving work. She has previously been on Imitrex and currently uses [place] as needed, which she finds effective, although she tries to delay taking medication until necessary. She missed a neurology appointment on November 05, 2022, due to a change in contact information.  She has a history of asthma and uses albuterol as needed for flare-ups. With the change in weather, her asthma is currently stable, but she requests a refill of her albuterol prescription.  She reports a concern about a vaginal discharge, describing it a yellowish tint and some itchiness. She is also experiencing a delay in her menstrual cycle, which was last on February 14 and lasted five days. She is sexually active but not using birth control, and she doubts pregnancy due to infrequent intercourse. She has been taking prenatal vitamins to boost fertility and is considering natural methods to conceive before seeking fertility clinic assistance. She has not yet established care with a gynecologist and is due for a  Pap smear.       11/22/2022    1:45 PM 11/05/2022   11:07 AM 09/27/2022    2:53 PM  Depression screen PHQ 2/9  Decreased Interest 0 0 0  Down, Depressed, Hopeless 0 0 0  PHQ - 2 Score 0 0 0    Relevant past medical, surgical, family and social history reviewed and updated as indicated. Interim medical history since our last visit reviewed. Allergies and medications reviewed and updated.  Review of Systems  Constitutional: Negative for fever or weight change.  Respiratory: Negative for cough and shortness of breath.   Cardiovascular: Negative for chest pain or palpitations.  Gastrointestinal: Negative for abdominal pain, no bowel changes.  GU: positive for vaginal discharge Musculoskeletal: Negative for gait problem or joint swelling.  Skin: Negative for rash.  Neurological: Negative for dizziness or headache.  No other specific complaints in a complete review of systems (except as listed in HPI above).      Objective:    BP 126/78   Pulse 78   Temp 98.2 F (36.8 C)   Resp 18   Ht 5\' 9"  (1.753 m)   Wt 209 lb 1.6 oz (94.8 kg)   LMP 02/21/2023   SpO2 96%   BMI 30.88 kg/m    Wt Readings from Last 3 Encounters:  03/27/23 209 lb 1.6 oz (94.8 kg)  11/22/22 200 lb 4.8  oz (90.9 kg)  11/05/22 203 lb (92.1 kg)    Physical Exam   Physical Exam GENERAL: Alert, cooperative, well developed, no acute distress. HEENT: Normocephalic, normal oropharynx, moist mucous membranes. CHEST: Clear to auscultation bilaterally, no wheezes, rhonchi, or crackles. CARDIOVASCULAR: Normal heart rate and rhythm, S1 and S2 normal without murmurs. ABDOMEN: Soft, non-tender, non-distended, without organomegaly, normal bowel sounds. EXTREMITIES: No cyanosis or edema. NEUROLOGICAL: Cranial nerves grossly intact, moves all extremities without gross motor or sensory deficit.   Results for orders placed or performed in visit on 03/27/23  POCT urine pregnancy   Collection Time: 03/27/23  3:18 PM   Result Value Ref Range   Preg Test, Ur Negative Negative       Assessment & Plan:   Problem List Items Addressed This Visit       Cardiovascular and Mediastinum   Migraine without aura and without status migrainosus, not intractable - Primary   Relevant Orders   Ambulatory referral to Neurology     Respiratory   Mild intermittent asthma without complication   Relevant Medications   albuterol (VENTOLIN HFA) 108 (90 Base) MCG/ACT inhaler   Other Visit Diagnoses       Encounter for completion of form with patient         Vaginal discharge       Relevant Orders   Cervicovaginal ancillary only     Amenorrhea       Relevant Orders   POCT urine pregnancy (Completed)     Women's annual routine gynecological examination       Relevant Orders   Ambulatory referral to Gynecology        Assessment and Plan Assessment & Plan FMLA paperwork for migraines She experiences approximately seven migraines per month, requiring her to leave work. [name] is effective for acute management. She previously used propranolol but discontinued it. A neurology referral was placed in October 2024, but she did not respond due to a change in contact information. - Submit FMLA paperwork - Place a new referral to neurology  Vaginal discharge She reports a gross, itchy, yellowish vaginal discharge. The differential diagnosis includes infection or other gynecological issues. She also experiences a delayed menstrual cycle, which could be related. A urine pregnancy test was negative. - Perform a vaginal swab for discharge analysis  Fertility concerns She is attempting to conceive but reports irregular menstrual cycles and fertility concerns. She is not using birth control and takes prenatal vitamins. She lacks a gynecologist and is considering natural methods before fertility clinic assistance. A gynecology referral is necessary for further evaluation and management. - Refer to gynecology for further  evaluation and management  Asthma She uses albuterol as needed for asthma flare-ups. Her asthma is well-managed with the change in weather, but she requires an albuterol inhaler refill. - Prescribe albuterol inhaler        Follow up plan: Return in about 6 months (around 09/27/2023) for cpe.

## 2023-03-31 ENCOUNTER — Encounter: Payer: Self-pay | Admitting: Nurse Practitioner

## 2023-03-31 LAB — CERVICOVAGINAL ANCILLARY ONLY
Bacterial Vaginitis (gardnerella): NEGATIVE
Candida Glabrata: NEGATIVE
Candida Vaginitis: NEGATIVE
Chlamydia: NEGATIVE
Comment: NEGATIVE
Comment: NEGATIVE
Comment: NEGATIVE
Comment: NEGATIVE
Comment: NEGATIVE
Comment: NORMAL
Neisseria Gonorrhea: NEGATIVE
Trichomonas: NEGATIVE

## 2023-04-01 ENCOUNTER — Encounter: Payer: Self-pay | Admitting: Advanced Practice Midwife

## 2023-04-01 ENCOUNTER — Telehealth: Payer: Self-pay

## 2023-04-01 NOTE — Telephone Encounter (Signed)
 Phone call to patient regarding MyChart medical question. No answer LMTC with provided contact number. Tawny Hopping, RN

## 2023-04-02 ENCOUNTER — Encounter: Payer: Self-pay | Admitting: Nurse Practitioner

## 2023-04-28 ENCOUNTER — Encounter: Payer: Self-pay | Admitting: Licensed Practical Nurse

## 2023-04-28 ENCOUNTER — Other Ambulatory Visit (HOSPITAL_COMMUNITY)
Admission: RE | Admit: 2023-04-28 | Discharge: 2023-04-28 | Disposition: A | Discharge status: Home / Self Care | Source: Ambulatory Visit | Attending: Licensed Practical Nurse | Admitting: Licensed Practical Nurse

## 2023-04-28 ENCOUNTER — Ambulatory Visit (INDEPENDENT_AMBULATORY_CARE_PROVIDER_SITE_OTHER): Admitting: Licensed Practical Nurse

## 2023-04-28 VITALS — BP 125/79 | HR 64 | Ht 69.0 in | Wt 211.1 lb

## 2023-04-28 DIAGNOSIS — Z124 Encounter for screening for malignant neoplasm of cervix: Secondary | ICD-10-CM | POA: Insufficient documentation

## 2023-04-28 DIAGNOSIS — Z13220 Encounter for screening for lipoid disorders: Secondary | ICD-10-CM

## 2023-04-28 DIAGNOSIS — Z01419 Encounter for gynecological examination (general) (routine) without abnormal findings: Secondary | ICD-10-CM | POA: Insufficient documentation

## 2023-04-28 DIAGNOSIS — Z113 Encounter for screening for infections with a predominantly sexual mode of transmission: Secondary | ICD-10-CM | POA: Insufficient documentation

## 2023-04-28 DIAGNOSIS — Z3169 Encounter for other general counseling and advice on procreation: Secondary | ICD-10-CM

## 2023-04-28 DIAGNOSIS — Z131 Encounter for screening for diabetes mellitus: Secondary | ICD-10-CM

## 2023-04-28 NOTE — Progress Notes (Signed)
 Gynecology Annual Exam   PCP: Quinton Buckler, FNP  Chief Complaint:  Chief Complaint  Patient presents with   Gynecologic Exam    History of Present Illness: Patient is a 33 y.o. G0P0000 presents for annual exam. The patient would like to discuss trying to conceive.   Christine Conway reports she has been TTC for 1 year, they have IC about once or twice a week, she has not been tracking her ovulation, she is not sure if she ovulates, but she does see changes to her discharge throughout her cycle. She recently started a PNV. She is not sure if her had the chicken pox or the vaccine.   Christine Conway was prescribed Yaz for irregular cycles, she only took it for about 1 month because she bled continuously on it.   Christine Conway has a hx Asthma, it has been acting up recently   LMP: Patient's last menstrual period was 04/12/2023 (exact date). Average Interval: irregular, sometimes skips 1 month Duration of flow: 4 days Heavy Menses: yes Clots: yes Intermenstrual Bleeding: no Postcoital Bleeding: no Dysmenorrhea: yes  The patient is sexually active with one female partner, he is 46y/o has 3 children ages 29,19 and 61. . She currently uses none for contraception. She denies dyspareunia.  The patient does not perform self breast exams.  There is no notable family history of breast or ovarian cancer in her family.  The patient wears seatbelts: yes.   The patient has regular exercise: no.   -She is interested in weight loss   The patient denies current symptoms of depression.    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 Needs liver enzymes repeated will collect today  Dentist last seen 2 months ago Eye exam scheduled for April 29   Past Medical History:  Patient Active Problem List   Diagnosis Date Noted  Date Diagnosed   Heavy menstrual bleeding 10/29/2022    Anemia 09/27/2022    Mild intermittent asthma without complication 03/12/2022    Migraine without aura and without status migrainosus, not intractable 03/12/2022    Obesity BMI=31.7 02/09/2021     Past Surgical History:  Past Surgical History:  Procedure Laterality Date   Denies surgical hx      Gynecologic History:  Patient's last menstrual period was 04/12/2023 (exact date). Contraception: none Last Pap: Results were: no abnormalities 2023  Obstetric History: G0P0000  Family History:  Family History  Problem Relation Age of Onset   Heart attack Father    Cancer Mother    Breast cancer Cousin    Kidney disease Cousin     Social History:  Social History   Socioeconomic History   Marital status: Single    Spouse name: Not on file   Number of children: 0   Years of education: 12   Highest education level: 12th grade  Occupational History   Occupation: Location manager    Comment: [employer]  Tobacco Use   Smoking status: Never    Passive exposure: Current   Smokeless tobacco: Never  Vaping Use   Vaping status: Never Used  Substance and Sexual Activity   Alcohol use: Not Currently    Comment: Reports has only ever had 1 drink. 4 loco   Drug use: Never   Sexual  activity: Yes    Partners: Male    Birth control/protection: None  Other Topics Concern   Not on file  Social History Narrative   Work at [employer] as a Location manager   Social Drivers of Health   Financial Resource Strain: High Risk (03/24/2023)   Overall Financial Resource Strain (CARDIA)    Difficulty of Paying Living Expenses: Hard  Food Insecurity: Food Insecurity Present (03/24/2023)   Hunger Vital Sign    Worried About Running Out of Food in the Last Year: Often true    Ran Out of Food in the Last Year: Often true  Transportation Needs: No Transportation Needs (03/24/2023)   PRAPARE - Administrator, Civil Service  (Medical): No    Lack of Transportation (Non-Medical): No  Physical Activity: Sufficiently Active (03/24/2023)   Exercise Vital Sign    Days of Exercise per Week: 7 days    Minutes of Exercise per Session: 30 min  Stress: No Stress Concern Present (03/24/2023)   [employer] of Occupational Health - Occupational Stress Questionnaire    Feeling of Stress : Only a little  Social Connections: Moderately Isolated (03/24/2023)   Social Connection and Isolation Panel [NHANES]    Frequency of Communication with Friends and Family: Twice a week    Frequency of Social Gatherings with Friends and Family: Twice a week    Attends Religious Services: Never    Database administrator or Organizations: No    Attends Engineer, structural: Not on file    Marital Status: Living with partner  Intimate Partner Violence: Not At Risk (02/09/2021)   Humiliation, Afraid, Rape, and Kick questionnaire    Fear of Current or Ex-Partner: No    Emotionally Abused: No    Physically Abused: No    Sexually Abused: No    Allergies:  No Known Allergies  Medications: Prior to Admission medications   Medication Sig Start Date End Date Taking? Authorizing Provider  albuterol  (VENTOLIN  HFA) 108 (90 Base) MCG/ACT inhaler Inhale 2 puffs into the lungs every 6 (six) hours as needed for wheezing or shortness of breath. 03/27/23  Yes Quinton Buckler, FNP  UBRELVY  50 MG TABS TAKE 1 TABLET BY MOUTH AS NEEDED FOR MIGRAINE HEADACHE 01/13/23  Yes Pender, Julie F, FNP  fluconazole  (DIFLUCAN ) 150 MG tablet Take 1 tablet (150 mg total) by mouth every 3 (three) days as needed (for vaginal itching/yeast infection sx). 11/26/22   Quinton Buckler, FNP  fluconazole  (DIFLUCAN ) 150 MG tablet Take 1 tablet (150 mg total) by mouth as needed (for vaginal itching/yeast infection sx after sexual activity). 11/27/22   Quinton Buckler, FNP  Iron , Ferrous Sulfate , 325 (65 Fe) MG TABS Take 325 mg by mouth daily. Patient not taking: Reported  on 04/28/2023 09/30/22   Quinton Buckler, FNP    Physical Exam Vitals: Blood pressure 125/79, pulse 64, height 5\' 9"  (1.753 m), weight 211 lb 1.6 oz (95.8 kg), last menstrual period 04/12/2023.  General: NAD HEENT: normocephalic, anicteric Thyroid: no enlargement, no palpable nodules Pulmonary: No increased work of breathing, CTAB except some wheezing in lower left lobe  Cardiovascular: RRR, distal pulses 2+ Breast: Breast symmetrical, no tenderness, no palpable nodules or masses, no skin or nipple retraction present, no nipple discharge.  No axillary or supraclavicular lymphadenopathy. Abdomen: NABS, soft, non-tender, non-distended.  Umbilicus without lesions.  No hepatomegaly, splenomegaly or masses palpable. No evidence of hernia  Genitourinary:  External: Normal external female genitalia.  Normal  urethral meatus, normal Bartholin's and Skene's glands.    Vagina: Normal vaginal mucosa, no evidence of prolapse.  Good tone  Cervix: Grossly normal in appearance, no bleeding  Uterus: Non-enlarged, mobile, normal contour.  No CMT  Adnexa: ovaries non-enlarged, no adnexal masses  Rectal: deferred  Lymphatic: no evidence of inguinal lymphadenopathy Extremities: no edema, erythema, or tenderness Neurologic: Grossly intact Psychiatric: mood appropriate, affect full    Assessment: 33 y.o. G0P0000 routine annual exam  Plan: Problem List Items Addressed This Visit   None Visit Diagnoses       Well woman exam    -  Primary   Relevant Orders   FSH/LH   Hemoglobin A1c   TSH   Progesterone    HEP, RPR, HIV Panel   Hepatitis C antibody   Lipid panel   Varicella zoster antibody, IgG   Rubella screen   Cytology - PAP     Pre-conception counseling       Relevant Orders   FSH/LH   Progesterone    Varicella zoster antibody, IgG   Rubella screen     Screening examination for venereal disease       Relevant Orders   HEP, RPR, HIV Panel   Hepatitis C antibody   Cytology - PAP     Lipid  screening       Relevant Orders   Lipid panel     Screening for diabetes mellitus       Relevant Orders   Hemoglobin A1c     Cervical cancer screening       Relevant Orders   Cytology - PAP       2) STI screening  wasoffered and accepted  2)  ASCCP guidelines and rational discussed.  Patient opts for every 3 years screening interval  3) Contraception - the patient is currently using  none.  She is attempting to conceive in the near future  4) Routine healthcare maintenance including cholesterol, diabetes screening discussed Ordered today Will follow up with her PCP   5) TTC: will use OPK for the next 3 months. Stressed the importance of timed IC around the time of the ovulation/LH surge.  If unable to achieve pregnancy, will RTC. Discussed ways to optimize pregnancy, rec at least 10% weight loss. Starting PNV and Folic acid, increasing physical activity.   Anice Kerbs, CNM   [facility] 04/28/2023, 4:44 PM

## 2023-04-29 ENCOUNTER — Encounter: Payer: Self-pay | Admitting: Licensed Practical Nurse

## 2023-04-29 LAB — RUBELLA SCREEN: Rubella Antibodies, IGG: 3.49 {index}

## 2023-04-29 LAB — FSH/LH
FSH: 3.4 m[IU]/mL
LH: 11.9 m[IU]/mL

## 2023-04-29 LAB — PROGESTERONE: Progesterone: 2.9 ng/mL

## 2023-04-29 LAB — LIPID PANEL
Chol/HDL Ratio: 4.9 ratio — ABNORMAL HIGH (ref 0.0–4.4)
Cholesterol, Total: 215 mg/dL — ABNORMAL HIGH (ref 100–199)
HDL: 44 mg/dL
LDL Chol Calc (NIH): 139 mg/dL — ABNORMAL HIGH (ref 0–99)
Triglycerides: 177 mg/dL — ABNORMAL HIGH (ref 0–149)
VLDL Cholesterol Cal: 32 mg/dL (ref 5–40)

## 2023-04-29 LAB — HEPATITIS C ANTIBODY: Hep C Virus Ab: NONREACTIVE

## 2023-04-29 LAB — HEP, RPR, HIV PANEL
HIV Screen 4th Generation wRfx: NONREACTIVE
Hepatitis B Surface Ag: NEGATIVE
RPR Ser Ql: NONREACTIVE

## 2023-04-29 LAB — VARICELLA ZOSTER ANTIBODY, IGG: Varicella zoster IgG: REACTIVE

## 2023-04-29 LAB — HEMOGLOBIN A1C
Est. average glucose Bld gHb Est-mCnc: 103 mg/dL
Hgb A1c MFr Bld: 5.2 % (ref 4.8–5.6)

## 2023-04-29 LAB — TSH: TSH: 1.25 u[IU]/mL (ref 0.450–4.500)

## 2023-04-30 ENCOUNTER — Encounter: Payer: Self-pay | Admitting: Licensed Practical Nurse

## 2023-04-30 LAB — CYTOLOGY - PAP
Chlamydia: NEGATIVE
Comment: NEGATIVE
Comment: NEGATIVE
Comment: NEGATIVE
Comment: NORMAL
Diagnosis: NEGATIVE
High risk HPV: NEGATIVE
Neisseria Gonorrhea: NEGATIVE
Trichomonas: NEGATIVE

## 2023-05-07 ENCOUNTER — Ambulatory Visit: Payer: BC Managed Care – PPO | Admitting: Nurse Practitioner

## 2023-05-16 ENCOUNTER — Ambulatory Visit: Admitting: Nurse Practitioner

## 2023-05-16 NOTE — Progress Notes (Deleted)
 LMP 04/12/2023 (Exact Date)    Subjective:    Patient ID: Mertie Abt, female    DOB: 31-Mar-1990, 31 y.o.   MRN: [id number]  HPI: ADELE TILLISON is a 33 y.o. female  No chief complaint on file.   Discussed the use of AI scribe software for clinical note transcription with the patient, who gave verbal consent to proceed.  History of Present Illness          11/22/2022    1:45 PM 11/05/2022   11:07 AM 09/27/2022    2:53 PM  Depression screen PHQ 2/9  Decreased Interest 0 0 0  Down, Depressed, Hopeless 0 0 0  PHQ - 2 Score 0 0 0    Relevant past medical, surgical, family and social history reviewed and updated as indicated. Interim medical history since our last visit reviewed. Allergies and medications reviewed and updated.  Review of Systems  Per HPI unless specifically indicated above     Objective:      LMP 04/12/2023 (Exact Date)   {Vitals History (Optional):23777} Wt Readings from Last 3 Encounters:  04/28/23 211 lb 1.6 oz (95.8 kg)  03/27/23 209 lb 1.6 oz (94.8 kg)  11/22/22 200 lb 4.8 oz (90.9 kg)    Physical Exam Physical Exam    Results for orders placed or performed in visit on 04/28/23  Cytology - PAP   Collection Time: 04/28/23  2:17 PM  Result Value Ref Range   High risk HPV Negative    Neisseria Gonorrhea Negative    Chlamydia Negative    Trichomonas Negative    Adequacy      Satisfactory for evaluation; transformation zone component PRESENT.   Diagnosis      - Negative for intraepithelial lesion or malignancy (NILM)   Comment Normal Reference Range Trichomonas - Negative    Comment Normal Reference Range HPV - Negative    Comment Normal Reference Ranger Chlamydia - Negative    Comment      Normal Reference Range Neisseria Gonorrhea - Negative  FSH/LH   Collection Time: 04/28/23  2:24 PM  Result Value Ref Range   LH 11.9 mIU/mL   FSH 3.4 mIU/mL  Hemoglobin A1c   Collection Time: 04/28/23  2:24 PM  Result Value Ref Range    Hgb A1c MFr Bld 5.2 4.8 - 5.6 %   Est. average glucose Bld gHb Est-mCnc 103 mg/dL  TSH   Collection Time: 04/28/23  2:24 PM  Result Value Ref Range   TSH 1.250 0.450 - 4.500 uIU/mL  Progesterone    Collection Time: 04/28/23  2:24 PM  Result Value Ref Range   Progesterone  2.9 ng/mL  HEP, RPR, HIV Panel   Collection Time: 04/28/23  2:24 PM  Result Value Ref Range   Hepatitis B Surface Ag Negative Negative   RPR Ser Ql Non Reactive Non Reactive   HIV Screen 4th Generation wRfx Non Reactive Non Reactive  Hepatitis C antibody   Collection Time: 04/28/23  2:24 PM  Result Value Ref Range   Hep C Virus Ab Non Reactive Non Reactive  Lipid panel   Collection Time: 04/28/23  2:24 PM  Result Value Ref Range   Cholesterol, Total 215 (H) 100 - 199 mg/dL   Triglycerides 086 (H) 0 - 149 mg/dL   HDL 44 >57 mg/dL   VLDL Cholesterol Cal 32 5 - 40 mg/dL   LDL Chol Calc (NIH) 846 (H) 0 - 99 mg/dL   Chol/HDL Ratio 4.9 (H) 0.0 -  4.4 ratio  Varicella zoster antibody, IgG   Collection Time: 04/28/23  2:24 PM  Result Value Ref Range   Varicella zoster IgG Reactive Non Reactive  Rubella screen   Collection Time: 04/28/23  2:24 PM  Result Value Ref Range   Rubella Antibodies, IGG 3.49 Immune >0.99 index   {Labs (Optional):23779}       Assessment & Plan:   Problem List Items Addressed This Visit   None    Assessment and Plan Assessment & Plan         Follow up plan: No follow-ups on file.

## 2023-05-30 ENCOUNTER — Ambulatory Visit: Admitting: Nurse Practitioner

## 2023-09-18 ENCOUNTER — Ambulatory Visit: Admitting: Nurse Practitioner

## 2023-09-19 ENCOUNTER — Ambulatory Visit: Admitting: Nurse Practitioner

## 2023-09-22 ENCOUNTER — Ambulatory Visit: Admitting: Nurse Practitioner

## 2023-09-26 ENCOUNTER — Ambulatory Visit: Admitting: Nurse Practitioner

## 2023-09-29 ENCOUNTER — Other Ambulatory Visit (HOSPITAL_COMMUNITY)
Admission: RE | Admit: 2023-09-29 | Discharge: 2023-09-29 | Disposition: A | Discharge status: Home / Self Care | Source: Ambulatory Visit | Attending: Nurse Practitioner | Admitting: Nurse Practitioner

## 2023-09-29 ENCOUNTER — Encounter: Payer: Self-pay | Admitting: Nurse Practitioner

## 2023-09-29 ENCOUNTER — Ambulatory Visit (INDEPENDENT_AMBULATORY_CARE_PROVIDER_SITE_OTHER): Admitting: Nurse Practitioner

## 2023-09-29 VITALS — BP 122/62 | HR 95 | Temp 98.6°F | Resp 18 | Ht 69.0 in | Wt 204.1 lb

## 2023-09-29 DIAGNOSIS — N926 Irregular menstruation, unspecified: Secondary | ICD-10-CM

## 2023-09-29 DIAGNOSIS — N898 Other specified noninflammatory disorders of vagina: Secondary | ICD-10-CM | POA: Insufficient documentation

## 2023-09-29 LAB — POCT URINALYSIS DIPSTICK
Bilirubin, UA: NEGATIVE
Blood, UA: NEGATIVE
Glucose, UA: NEGATIVE
Ketones, UA: NEGATIVE
Leukocytes, UA: NEGATIVE
Nitrite, UA: NEGATIVE
Protein, UA: NEGATIVE
Spec Grav, UA: 1.02 (ref 1.010–1.025)
Urobilinogen, UA: 0.2 U/dL
pH, UA: 6 (ref 5.0–8.0)

## 2023-09-29 NOTE — Patient Instructions (Addendum)
[facility]  [address] [place], [place] [address] Phone: [phone]  Fax: [phone]

## 2023-09-29 NOTE — Progress Notes (Signed)
 BP 122/62   Pulse 95   Temp 98.6 F (37 C)   Resp 18   Ht 5' 9 (1.753 m)   Wt 204 lb 1.6 oz (92.6 kg)   LMP 09/14/2023   SpO2 99%   BMI 30.14 kg/m    Subjective:    Patient ID: Christine Conway, female    DOB: 05/13/1990, 33 y.o.   MRN: [id number]  HPI: Christine Conway is a 33 y.o. female  Chief Complaint  Patient presents with   Vaginal Discharge    And smell   Menstrual Problem    Wants to get pregnant but irregular periods, told her to reach out to GYN    HPI:  Patient presents with urinary discharge for the past week. She describes the discharge has slightly discolored with a light odor. Denies any dysuria or urinary frequency, but sometimes has urinary frequency. Denies any hematuria, dysuria, or urinary incontinence. No abdominal, back, or suprapubic pain. Denies any nausea, vomiting, or fever. The patient is sexually active.   In addition, she is concerned about her irregular periods. Her last LMP was 09/14/2023. Per periods usually last between 3 to 7 days. She missed a menstrual cycle in August. No abnormal bleeding between periods. She has been attempting to become pregnant for the last 10 months. OBGYN previously provided her with an ovulation kit. Discussed with patient about scheduling an appointment with her OBGYN. Provided information about a nearby fertility clinic.       11/22/2022    1:45 PM 11/05/2022   11:07 AM 09/27/2022    2:53 PM  Depression screen PHQ 2/9  Decreased Interest 0 0 0  Down, Depressed, Hopeless 0 0 0  PHQ - 2 Score 0 0 0    Relevant past medical, surgical, family and social history reviewed and updated as indicated. Interim medical history since our last visit reviewed. Allergies and medications reviewed and updated.  Review of Systems  Constitutional: Negative for fever or weight change.  Respiratory: Negative for cough and shortness of breath.   Cardiovascular: Negative for chest pain or palpitations.  Gastrointestinal:  Negative for abdominal pain, no bowel changes.  Musculoskeletal: Negative for gait problem or joint swelling.  Skin: Negative for rash.  Neurological: Negative for dizziness or headache.  No other specific complaints in a complete review of systems (except as listed in HPI above).      Objective:     BP 122/62   Pulse 95   Temp 98.6 F (37 C)   Resp 18   Ht 5' 9 (1.753 m)   Wt 204 lb 1.6 oz (92.6 kg)   LMP 09/14/2023   SpO2 99%   BMI 30.14 kg/m    Wt Readings from Last 3 Encounters:  09/29/23 204 lb 1.6 oz (92.6 kg)  04/28/23 211 lb 1.6 oz (95.8 kg)  03/27/23 209 lb 1.6 oz (94.8 kg)    Physical Exam Constitutional:      Appearance: Normal appearance.  Cardiovascular:     Rate and Rhythm: Normal rate and regular rhythm.     Heart sounds: Normal heart sounds.  Pulmonary:     Effort: Pulmonary effort is normal.     Breath sounds: Normal breath sounds.  Abdominal:     General: Bowel sounds are normal.     Palpations: Abdomen is soft.  Skin:    General: Skin is warm and dry.  Neurological:     General: No focal deficit present.  Mental Status: She is alert and oriented to person, place, and time. Mental status is at baseline.  Psychiatric:        Mood and Affect: Mood normal.        Behavior: Behavior normal.        Thought Content: Thought content normal.        Judgment: Judgment normal.      Results for orders placed or performed in visit on 09/29/23  POCT Urinalysis Dipstick   Collection Time: 09/29/23  2:13 PM  Result Value Ref Range   Color, UA yellow    Clarity, UA clear    Glucose, UA Negative Negative   Bilirubin, UA neg    Ketones, UA neg    Spec Grav, UA 1.020 1.010 - 1.025   Blood, UA neg    pH, UA 6.0 5.0 - 8.0   Protein, UA Negative Negative   Urobilinogen, UA 0.2 0.2 or 1.0 E.U./dL   Nitrite, UA neg    Leukocytes, UA Negative Negative   Appearance clear    Odor noprmal    Urinalysis negative for urinary tract infection.          Assessment & Plan:   Problem List Items Addressed This Visit   None Visit Diagnoses       Vaginal discharge    -  Primary   Endorses urinary discharge. Urinalysis and vaginal swab ordered.   Relevant Orders   POCT Urinalysis Dipstick (Completed)   Cervicovaginal ancillary only     Irregular periods       Endorses irregular periods. Attempting to get pregnant. Advised patient to make an appointment with her OBGYN.            Follow up plan: Return if symptoms worsen or fail to improve.    I have reviewed this encounter including the documentation in this note and/or discussed this patient with the provider, Alexa Everhart SNP, I am certifying that I agree with the content of this note as supervising/preceptor nurse practitioner.  Mliss Spray, FNP-C [facility] 09/29/2023, 3:07 PM

## 2023-09-30 LAB — CERVICOVAGINAL ANCILLARY ONLY
Bacterial Vaginitis (gardnerella): POSITIVE — AB
Candida Glabrata: NEGATIVE
Candida Vaginitis: NEGATIVE
Chlamydia: NEGATIVE
Comment: NEGATIVE
Comment: NEGATIVE
Comment: NEGATIVE
Comment: NEGATIVE
Comment: NEGATIVE
Comment: NORMAL
Neisseria Gonorrhea: NEGATIVE
Trichomonas: NEGATIVE

## 2023-10-01 ENCOUNTER — Encounter: Payer: Self-pay | Admitting: Nurse Practitioner

## 2023-10-01 ENCOUNTER — Ambulatory Visit: Payer: Self-pay | Admitting: Nurse Practitioner

## 2023-10-01 DIAGNOSIS — N760 Acute vaginitis: Secondary | ICD-10-CM

## 2023-10-01 MED ORDER — METRONIDAZOLE 500 MG PO TABS: 500.0000 mg | ORAL_TABLET | Freq: Two times a day (BID) | ORAL | 0 refills | Status: AC

## 2023-10-23 DIAGNOSIS — G43909 Migraine, unspecified, not intractable, without status migrainosus: Secondary | ICD-10-CM | POA: Diagnosis not present

## 2023-10-23 DIAGNOSIS — Z1331 Encounter for screening for depression: Secondary | ICD-10-CM | POA: Diagnosis not present

## 2023-10-23 DIAGNOSIS — F411 Generalized anxiety disorder: Secondary | ICD-10-CM | POA: Diagnosis not present

## 2023-11-26 ENCOUNTER — Other Ambulatory Visit (HOSPITAL_COMMUNITY)
Admission: RE | Admit: 2023-11-26 | Discharge: 2023-11-26 | Disposition: A | Discharge status: Home / Self Care | Source: Ambulatory Visit | Attending: Nurse Practitioner | Admitting: Nurse Practitioner

## 2023-11-26 ENCOUNTER — Ambulatory Visit (INDEPENDENT_AMBULATORY_CARE_PROVIDER_SITE_OTHER): Admitting: Nurse Practitioner

## 2023-11-26 VITALS — BP 130/88 | HR 51 | Temp 98.0°F | Ht 69.0 in | Wt 199.0 lb

## 2023-11-26 DIAGNOSIS — N898 Other specified noninflammatory disorders of vagina: Secondary | ICD-10-CM | POA: Diagnosis not present

## 2023-11-26 DIAGNOSIS — F419 Anxiety disorder, unspecified: Secondary | ICD-10-CM | POA: Diagnosis not present

## 2023-11-26 DIAGNOSIS — G43009 Migraine without aura, not intractable, without status migrainosus: Secondary | ICD-10-CM

## 2023-11-26 DIAGNOSIS — R030 Elevated blood-pressure reading, without diagnosis of hypertension: Secondary | ICD-10-CM

## 2023-11-26 LAB — POCT URINALYSIS DIPSTICK
Bilirubin, UA: NEGATIVE
Blood, UA: NEGATIVE
Glucose, UA: NEGATIVE
Ketones, UA: NEGATIVE
Leukocytes, UA: NEGATIVE
Nitrite, UA: NEGATIVE
Protein, UA: NEGATIVE
Spec Grav, UA: 1.015 (ref 1.010–1.025)
Urobilinogen, UA: 0.2 U/dL
pH, UA: 5 (ref 5.0–8.0)

## 2023-11-26 MED ORDER — UBRELVY 50 MG PO TABS: 50.0000 mg | ORAL_TABLET | ORAL | 3 refills | Status: AC | PRN

## 2023-11-26 NOTE — Progress Notes (Signed)
 BP 130/88   Pulse (!) 51   Temp 98 F (36.7 C)   Ht 5' 9 (1.753 m)   Wt 199 lb (90.3 kg)   LMP 11/24/2023   SpO2 98%   BMI 29.39 kg/m    Subjective:    Patient ID: Christine Conway, female    DOB: 1990-02-29, 33 y.o.   MRN: [id number]  HPI: Christine Conway is a 33 y.o. female  Chief Complaint  Patient presents with   Vaginal Discharge    Pt c/o vaginal discharge and odor x2 weeks.    Discussed the use of AI scribe software for clinical note transcription with the patient, who gave verbal consent to proceed.  History of Present Illness Christine Conway is a 33 year old female who presents with vaginal discharge and odor for two weeks.  Vaginal discharge and odor - Vaginal discharge and unusual odor present for two weeks - Odor is not fish-like - Symptoms tend to appear a day or two after sexual intercourse - History of bacterial vaginosis with concern for recurrence - No use of douching - Cleanses with Dove soap  Menstrual irregularity and fertility concerns - History of irregular menstrual cycles - Actively tracking cycle to determine fertile days - Cycles have been somewhat regular over the past few months, with periods starting on the 7th of September and October, but no period in November - Sexually active and trying to conceive - Concerned that irregular cycles and potential infections may affect fertility  Anxiety and psychosocial stressors - Experiences anxiety, attributed to past incarceration from 2015 to 2022 - Anxiety spikes randomly, especially when overwhelmed or when encountering law enforcement - Currently experiencing stress related to moving to a new house - Additional stress due to potential need to rehome her dog because of landlord restrictions  Migraine headaches - History of migraines - Uses Ubrelvy  as needed for relief   Elevated blood pressure today- patient reports she feels a little stressed right now.  Recommend patient continue to  monitor.       11/26/2023   11:09 AM 11/22/2022    1:45 PM 11/05/2022   11:07 AM  Depression screen PHQ 2/9  Decreased Interest 0 0 0  Down, Depressed, Hopeless 0 0 0  PHQ - 2 Score 0 0 0    Relevant past medical, surgical, family and social history reviewed and updated as indicated. Interim medical history since our last visit reviewed. Allergies and medications reviewed and updated.  Review of Systems  Ten systems reviewed and is negative except as mentioned in HPI      Objective:      BP 130/88   Pulse (!) 51   Temp 98 F (36.7 C)   Ht 5' 9 (1.753 m)   Wt 199 lb (90.3 kg)   LMP 11/24/2023   SpO2 98%   BMI 29.39 kg/m    Wt Readings from Last 3 Encounters:  11/26/23 199 lb (90.3 kg)  09/29/23 204 lb 1.6 oz (92.6 kg)  04/28/23 211 lb 1.6 oz (95.8 kg)    Physical Exam GENERAL: Alert, cooperative, well developed, no acute distress HEENT: Normocephalic, normal oropharynx, moist mucous membranes CHEST: Clear to auscultation bilaterally, No wheezes, rhonchi, or crackles CARDIOVASCULAR: Normal heart rate and rhythm, S1 and S2 normal without murmurs ABDOMEN: Soft, non-tender, non-distended, without organomegaly, Normal bowel sounds EXTREMITIES: No cyanosis or edema NEUROLOGICAL: Cranial nerves grossly intact, Moves all extremities without gross motor or sensory deficit  Assessment & Plan:   Problem List Items Addressed This Visit       Cardiovascular and Mediastinum   Migraine without aura and without status migrainosus, not intractable   Relevant Medications   Ubrogepant  (UBRELVY ) 50 MG TABS     Other   Anxiety   Other Visit Diagnoses       Vaginal discharge    -  Primary   Relevant Orders   POCT Urinalysis Dipstick (Completed)   Cervicovaginal ancillary only     Elevated blood pressure reading            Assessment and Plan Assessment & Plan Vaginal discharge and odor, rule out bacterial vaginosis Reports vaginal discharge and odor  for two weeks, not fishy. Sexually active with one partner. Concerned about bacterial vaginosis recurrence. Discussed potential causes including sexual activity and partner as a carrier. Urine test was clean, ruling out UTI. - Performed vaginal swab to test for bacterial vaginosis - Advised partner to seek treatment if bacterial vaginosis is confirmed - Recommended starting yogurt to introduce probiotics and help maintain normal vaginal flora  Irregular menstrual cycles Reports irregular menstrual cycles with difficulty tracking ovulation. Cycle tracking app shows some regularity but still irregular. Concerned about impact on fertility. - Continue using cycle tracking app to monitor menstrual cycles  Anxiety Reports anxiety with spikes triggered by stressors such as seeing officers or housing issues. Anxiety may contribute to elevated blood pressure. Discussed potential benefits of having an emotional support dog. - Provided note for emotional support dog to assist with anxiety management  Migraines -needs refill of ubrelvy   -doing well with current treatment plan      Follow up plan: Return if symptoms worsen or fail to improve.

## 2023-11-27 ENCOUNTER — Ambulatory Visit: Payer: Self-pay | Admitting: Nurse Practitioner

## 2023-11-27 DIAGNOSIS — B9689 Other specified bacterial agents as the cause of diseases classified elsewhere: Secondary | ICD-10-CM

## 2023-11-27 LAB — CERVICOVAGINAL ANCILLARY ONLY
Bacterial Vaginitis (gardnerella): POSITIVE — AB
Candida Glabrata: NEGATIVE
Candida Vaginitis: NEGATIVE
Chlamydia: NEGATIVE
Comment: NEGATIVE
Comment: NEGATIVE
Comment: NEGATIVE
Comment: NEGATIVE
Comment: NEGATIVE
Comment: NORMAL
Neisseria Gonorrhea: NEGATIVE
Trichomonas: NEGATIVE

## 2023-11-27 MED ORDER — METRONIDAZOLE 500 MG PO TABS: 500.0000 mg | ORAL_TABLET | Freq: Two times a day (BID) | ORAL | 0 refills | Status: AC

## 2024-01-29 ENCOUNTER — Ambulatory Visit (INDEPENDENT_AMBULATORY_CARE_PROVIDER_SITE_OTHER): Admitting: Nurse Practitioner

## 2024-01-29 ENCOUNTER — Other Ambulatory Visit (HOSPITAL_COMMUNITY)
Admission: RE | Admit: 2024-01-29 | Discharge: 2024-01-29 | Disposition: A | Source: Ambulatory Visit | Attending: Nurse Practitioner | Admitting: Nurse Practitioner

## 2024-01-29 ENCOUNTER — Encounter: Payer: Self-pay | Admitting: Nurse Practitioner

## 2024-01-29 VITALS — BP 122/84 | HR 61 | Temp 97.0°F | Ht 69.0 in | Wt 194.0 lb

## 2024-01-29 DIAGNOSIS — N898 Other specified noninflammatory disorders of vagina: Secondary | ICD-10-CM | POA: Diagnosis not present

## 2024-01-29 DIAGNOSIS — R350 Frequency of micturition: Secondary | ICD-10-CM | POA: Insufficient documentation

## 2024-01-29 LAB — POCT URINALYSIS DIPSTICK
Bilirubin, UA: NEGATIVE
Blood, UA: NEGATIVE
Glucose, UA: NEGATIVE
Ketones, UA: NEGATIVE
Nitrite, UA: NEGATIVE
Protein, UA: NEGATIVE
Spec Grav, UA: 1.02
Urobilinogen, UA: 0.2 U/dL
pH, UA: 5

## 2024-01-29 LAB — POCT URINE PREGNANCY: Preg Test, Ur: NEGATIVE

## 2024-01-29 NOTE — Addendum Note (Signed)
 Addended by: Jalaina Salyers F on: 01/29/2024 03:00 PM   Modules accepted: Orders

## 2024-01-29 NOTE — Progress Notes (Signed)
 "  BP 122/84   Pulse 61   Temp (!) 97 F (36.1 C)   Ht 5' 9 (1.753 m)   Wt 194 lb (88 kg)   LMP 01/03/2024   SpO2 96%   BMI 28.65 kg/m    Subjective:    Patient ID: Christine Conway, female    DOB: 11/06/1990, 35 y.o.   MRN: [id number]  HPI: Christine Conway is a 34 y.o. female  Chief Complaint  Patient presents with   Vaginal Discharge   Discussed the use of AI scribe software for clinical note transcription with the patient, who gave verbal consent to proceed.  History of Present Illness Christine Conway is a 34 year old female who presents with vaginal dryness and irritation.  Vaginal dryness and irritation - Vaginal dryness and irritation began last night. - Discharge described as 'white and dry.' - Irritation causes discomfort requiring frequent readjustment of underwear. - No fever or back pain.  Menstrual irregularity and fertility concerns - Irregular menstrual cycles. - Concerned about impact of vaginal symptoms on fertility.  Sexual activity and infection risk - Sexually active. - Concerned symptoms may be related to sexual activity. - Worried about possible bacterial infections, including bacterial vaginosis and urinary tract infection.  Genitourinary findings - Urine showed leukocytes; sample sent for culture. - No dysuria or hematuria reported.  Hygiene practices - Uses soap during washing. - Suspects hygiene practices may contribute to symptoms.  Abdominal sensations - Sensation of hardness in abdomen, attributed to possible bowel movements.        11/26/2023   11:09 AM 11/22/2022    1:45 PM 11/05/2022   11:07 AM  Depression screen PHQ 2/9  Decreased Interest 0 0 0  Down, Depressed, Hopeless 0 0 0  PHQ - 2 Score 0 0 0    Relevant past medical, surgical, family and social history reviewed and updated as indicated. Interim medical history since our last visit reviewed. Allergies and medications reviewed and updated.  Review of  Systems  Ten systems reviewed and is negative except as mentioned in HPI      Objective:      BP 122/84   Pulse 61   Temp (!) 97 F (36.1 C)   Ht 5' 9 (1.753 m)   Wt 194 lb (88 kg)   LMP 01/03/2024   SpO2 96%   BMI 28.65 kg/m    Wt Readings from Last 3 Encounters:  01/29/24 194 lb (88 kg)  11/26/23 199 lb (90.3 kg)  09/29/23 204 lb 1.6 oz (92.6 kg)    Physical Exam GENERAL: Alert, cooperative, well developed, no acute distress HEENT: Normocephalic, normal oropharynx, moist mucous membranes CHEST: Clear to auscultation bilaterally, No wheezes, rhonchi, or crackles CARDIOVASCULAR: Normal heart rate and rhythm, S1 and S2 normal without murmurs ABDOMEN: Soft, non-tender, non-distended, without organomegaly, Normal bowel sounds EXTREMITIES: No cyanosis or edema NEUROLOGICAL: Cranial nerves grossly intact, Moves all extremities without gross motor or sensory deficit  Results for orders placed or performed in visit on 01/29/24  POCT urinalysis dipstick   Collection Time: 01/29/24  2:50 PM  Result Value Ref Range   Color, UA dark yellow    Clarity, UA slightly cloudy    Glucose, UA Negative Negative   Bilirubin, UA negative    Ketones, UA negative    Spec Grav, UA 1.020 1.010 - 1.025   Blood, UA negative    pH, UA 5.0 5.0 - 8.0   Protein, UA Negative Negative  Urobilinogen, UA 0.2 0.2 or 1.0 E.U./dL   Nitrite, UA negative    Leukocytes, UA Moderate (2+) (A) Negative   Appearance     Odor    POCT urine pregnancy   Collection Time: 01/29/24  2:51 PM  Result Value Ref Range   Preg Test, Ur Negative Negative          Assessment & Plan:   Problem List Items Addressed This Visit   None Visit Diagnoses       Urinary frequency    -  Primary   Relevant Orders   Cervicovaginal ancillary only   POCT urinalysis dipstick (Completed)   POCT urine pregnancy (Completed)     Vaginal discharge            Assessment and Plan Assessment & Plan Vaginal  discharge White and dry, with irritation and discomfort. Differential diagnosis includes bacterial vaginosis (BV) and urinary tract infection (UTI). Sexually active and concerned about fertility implications. No fever or back pain. Possible contribution from soap use disrupting vaginal flora. Potential BV transmission to partner discussed, though partner may be reluctant to seek treatment. - Sent urine for culture - Advised against using soap for vaginal hygiene - Discussed potential BV transmission to partner and importance of partner treatment if BV is confirmed - Will follow up with results on Monday        Follow up plan: Return if symptoms worsen or fail to improve. "

## 2024-01-30 LAB — CERVICOVAGINAL ANCILLARY ONLY
Bacterial Vaginitis (gardnerella): POSITIVE — AB
Candida Glabrata: NEGATIVE
Candida Vaginitis: NEGATIVE
Chlamydia: NEGATIVE
Comment: NEGATIVE
Comment: NEGATIVE
Comment: NEGATIVE
Comment: NEGATIVE
Comment: NEGATIVE
Comment: NORMAL
Neisseria Gonorrhea: NEGATIVE
Trichomonas: NEGATIVE

## 2024-01-31 LAB — URINE CULTURE
MICRO NUMBER:: 17505560
Result:: NO GROWTH
SPECIMEN QUALITY:: ADEQUATE

## 2024-02-02 ENCOUNTER — Ambulatory Visit: Payer: Self-pay | Admitting: Nurse Practitioner

## 2024-02-02 DIAGNOSIS — B9689 Other specified bacterial agents as the cause of diseases classified elsewhere: Secondary | ICD-10-CM

## 2024-02-02 MED ORDER — METRONIDAZOLE 500 MG PO TABS: 500.0000 mg | ORAL_TABLET | Freq: Two times a day (BID) | ORAL | 0 refills | Status: AC

## 2024-02-11 ENCOUNTER — Encounter: Payer: Self-pay | Admitting: Nurse Practitioner

## 2024-02-11 ENCOUNTER — Ambulatory Visit: Admitting: Nurse Practitioner

## 2024-02-11 VITALS — BP 136/78 | HR 54 | Temp 97.0°F | Ht 69.0 in | Wt 197.0 lb

## 2024-02-11 DIAGNOSIS — Z3201 Encounter for pregnancy test, result positive: Secondary | ICD-10-CM

## 2024-02-11 LAB — POCT URINALYSIS DIPSTICK
Bilirubin, UA: NEGATIVE
Blood, UA: NEGATIVE
Glucose, UA: NEGATIVE
Ketones, UA: NEGATIVE
Leukocytes, UA: NEGATIVE
Nitrite, UA: NEGATIVE
Protein, UA: NEGATIVE
Spec Grav, UA: 1.015
Urobilinogen, UA: 0.2 U/dL
pH, UA: 5

## 2024-02-11 LAB — CBC WITH DIFFERENTIAL/PLATELET
Absolute Lymphocytes: 5001 {cells}/uL — ABNORMAL HIGH (ref 850–3900)
Absolute Monocytes: 585 {cells}/uL (ref 200–950)
Basophils Absolute: 67 {cells}/uL (ref 0–200)
Basophils Relative: 0.5 %
Eosinophils Absolute: 306 {cells}/uL (ref 15–500)
Eosinophils Relative: 2.3 %
HCT: 36.8 % (ref 35.9–46.0)
Hemoglobin: 12 g/dL (ref 11.7–15.5)
MCH: 28.7 pg (ref 27.0–33.0)
MCHC: 32.6 g/dL (ref 31.6–35.4)
MCV: 88 fL (ref 81.4–101.7)
MPV: 11.5 fL (ref 7.5–12.5)
Monocytes Relative: 4.4 %
Neutro Abs: 7342 {cells}/uL (ref 1500–7800)
Neutrophils Relative %: 55.2 %
Platelets: 278 10*3/uL (ref 140–400)
RBC: 4.18 Million/uL (ref 3.80–5.10)
RDW: 16.5 % — ABNORMAL HIGH (ref 11.0–15.0)
Total Lymphocyte: 37.6 %
WBC: 13.3 10*3/uL — ABNORMAL HIGH (ref 3.8–10.8)

## 2024-02-11 LAB — HCG, QUANTITATIVE, PREGNANCY: HCG, Total, QN: 2568 m[IU]/mL

## 2024-02-11 LAB — COMPREHENSIVE METABOLIC PANEL WITH GFR
AG Ratio: 1.8 (calc) (ref 1.0–2.5)
ALT: 7 U/L (ref 6–29)
AST: 10 U/L (ref 10–30)
Albumin: 4.6 g/dL (ref 3.6–5.1)
Alkaline phosphatase (APISO): 46 U/L (ref 31–125)
BUN: 11 mg/dL (ref 7–25)
CO2: 25 mmol/L (ref 20–32)
Calcium: 9.2 mg/dL (ref 8.6–10.2)
Chloride: 106 mmol/L (ref 98–110)
Creat: 0.89 mg/dL (ref 0.50–0.97)
Globulin: 2.6 g/dL (ref 1.9–3.7)
Glucose, Bld: 82 mg/dL (ref 65–99)
Potassium: 4.4 mmol/L (ref 3.5–5.3)
Sodium: 138 mmol/L (ref 135–146)
Total Bilirubin: 0.6 mg/dL (ref 0.2–1.2)
Total Protein: 7.2 g/dL (ref 6.1–8.1)
eGFR: 89 mL/min/{1.73_m2}

## 2024-02-11 LAB — POCT URINE PREGNANCY: Preg Test, Ur: POSITIVE — AB

## 2024-02-11 NOTE — Patient Instructions (Addendum)
 Approximately [redacted] weeks pregnant based on Last menstrual cycle Approximate due date 10/05/2024

## 2024-02-11 NOTE — Progress Notes (Signed)
 "  BP 136/78   Pulse (!) 54   Temp (!) 97 F (36.1 C)   Ht 5' 9 (1.753 m)   Wt 197 lb (89.4 kg)   LMP 01/03/2024   SpO2 98%   BMI 29.09 kg/m    Subjective:    Patient ID: Christine Conway, female    DOB: June 19, 1990, 34 y.o.   MRN: [id number]  HPI: Christine Conway is a 34 y.o. female  Chief Complaint  Patient presents with   Routine Prenatal Visit   Discussed the use of AI scribe software for clinical note transcription with the patient, who gave verbal consent to proceed.  History of Present Illness Christine Conway is a 33 year old female who presents with a positive pregnancy test.  Positive pregnancy test and menstrual history - Multiple positive home pregnancy tests after an initial negative test on January 29, 2024 - Four home tests positive, confirmed with [facility] test - Last menstrual period started December 30, 2023 and ended January 03, 2024 - No menstrual period since January 03, 2024  Gastrointestinal symptoms - Nausea and vomiting present - Increased hunger with early satiety - Feels full after consuming only half of a meal - Despite feeling 'starving', able to eat only small portions  Prenatal and medication use - Started prenatal vitamins, one pill daily, currently on second day of use - Previously prescribed Flagyl  for bacterial vaginosis, took three pills before pausing upon discovering pregnancy  Substance use - Works third shift and consumes approximately three cups of coffee per night, primarily cappuccino - Smokes marijuana         11/26/2023   11:09 AM 11/22/2022    1:45 PM 11/05/2022   11:07 AM  Depression screen PHQ 2/9  Decreased Interest 0 0 0  Down, Depressed, Hopeless 0 0 0  PHQ - 2 Score 0 0 0    Relevant past medical, surgical, family and social history reviewed and updated as indicated. Interim medical history since our last visit reviewed. Allergies and medications reviewed and updated.  Review of  Systems  Constitutional: Negative for fever or weight change.  Respiratory: Negative for cough and shortness of breath.   Cardiovascular: Negative for chest pain or palpitations.  Gastrointestinal: Negative for abdominal pain, no bowel changes.  Musculoskeletal: Negative for gait problem or joint swelling.  Skin: Negative for rash.  Neurological: Negative for dizziness or headache.  No other specific complaints in a complete review of systems (except as listed in HPI above).      Objective:      BP 136/78   Pulse (!) 54   Temp (!) 97 F (36.1 C)   Ht 5' 9 (1.753 m)   Wt 197 lb (89.4 kg)   LMP 01/03/2024   SpO2 98%   BMI 29.09 kg/m    Wt Readings from Last 3 Encounters:  02/11/24 197 lb (89.4 kg)  01/29/24 194 lb (88 kg)  11/26/23 199 lb (90.3 kg)    Physical Exam GENERAL: Alert, cooperative, well developed, no acute distress HEENT: Normocephalic, normal oropharynx, moist mucous membranes CHEST: Clear to auscultation bilaterally, No wheezes, rhonchi, or crackles CARDIOVASCULAR: Normal heart rate and rhythm, S1 and S2 normal without murmurs ABDOMEN: Soft, non-tender, non-distended, without organomegaly, Normal bowel sounds EXTREMITIES: No cyanosis or edema NEUROLOGICAL: Cranial nerves grossly intact, Moves all extremities without gross motor or sensory deficit  Results for orders placed or performed in visit on 02/11/24  POCT urine pregnancy   Collection Time:  02/11/24  2:51 PM  Result Value Ref Range   Preg Test, Ur Positive (A) Negative  POCT urinalysis dipstick   Collection Time: 02/11/24  3:12 PM  Result Value Ref Range   Color, UA yellow    Clarity, UA clear    Glucose, UA Negative Negative   Bilirubin, UA negative    Ketones, UA negative    Spec Grav, UA 1.015 1.010 - 1.025   Blood, UA negative    pH, UA 5.0 5.0 - 8.0   Protein, UA Negative Negative   Urobilinogen, UA 0.2 0.2 or 1.0 E.U./dL   Nitrite, UA negative    Leukocytes, UA Negative Negative    Appearance     Odor            Assessment & Plan:   Problem List Items Addressed This Visit   None Visit Diagnoses       Positive pregnancy test    -  Primary   Relevant Orders   POCT urinalysis dipstick (Completed)   POCT urine pregnancy (Completed)   Ambulatory referral to Obstetrics / Gynecology   CBC with Differential/Platelet   Comprehensive metabolic panel with GFR   hCG, quantitative, pregnancy        Assessment and Plan Assessment & Plan Supervision of normal first pregnancy Approximately six weeks pregnant with a due date of September 29th. Reports nausea and increased appetite. Currently taking prenatal vitamins. Advised to avoid sushi, soft cheeses, and lunch meats. Discussed the importance of reducing marijuana use due to potential effects on fetal growth. Encouraged to maintain hydration and nutrition despite nausea. - Continue prenatal vitamins, consider taking at bedtime if nausea increases. - Avoid sushi, soft cheeses, and lunch meats. - Reduce marijuana use. - Maintain hydration and nutrition. - Ordered beta quant blood work to assess  -hold ubrelvy  while pregnant  Bacterial vaginosis Previously diagnosed with bacterial vaginosis. Currently taking Flagyl , which is safe during pregnancy. Advised to complete the course of Flagyl . - Continue Flagyl  as prescribed.        Follow up plan: Return if symptoms worsen or fail to improve. "

## 2024-02-12 ENCOUNTER — Ambulatory Visit: Payer: Self-pay | Admitting: Nurse Practitioner

## 2024-02-20 ENCOUNTER — Telehealth

## 2024-03-09 ENCOUNTER — Other Ambulatory Visit

## 2024-03-23 ENCOUNTER — Encounter: Admitting: Obstetrics
